# Patient Record
Sex: Female | Born: 1950
Health system: Southern US, Community
[De-identification: ages and names within clinical notes are randomized; demographics above are authoritative.]

---

## 2001-07-20 ENCOUNTER — Emergency Department (HOSPITAL_COMMUNITY): Admission: EM | Admit: 2001-07-20 | Discharge: 2001-07-21 | Payer: Self-pay | Admitting: Emergency Medicine

## 2001-07-21 ENCOUNTER — Encounter: Payer: Self-pay | Admitting: Emergency Medicine

## 2008-03-02 ENCOUNTER — Ambulatory Visit: Payer: Self-pay | Admitting: Internal Medicine

## 2008-03-02 ENCOUNTER — Other Ambulatory Visit: Admission: RE | Admit: 2008-03-02 | Discharge: 2008-03-02 | Payer: Self-pay | Admitting: Internal Medicine

## 2009-07-23 ENCOUNTER — Ambulatory Visit: Payer: Self-pay | Admitting: Internal Medicine

## 2009-09-10 ENCOUNTER — Ambulatory Visit: Payer: Self-pay | Admitting: Internal Medicine

## 2009-09-10 ENCOUNTER — Other Ambulatory Visit: Admission: RE | Admit: 2009-09-10 | Discharge: 2009-09-10 | Payer: Self-pay | Admitting: Internal Medicine

## 2009-11-18 ENCOUNTER — Ambulatory Visit: Payer: Self-pay | Admitting: Internal Medicine

## 2010-02-28 ENCOUNTER — Ambulatory Visit: Payer: Self-pay | Admitting: Internal Medicine

## 2011-08-21 ENCOUNTER — Encounter: Payer: Self-pay | Admitting: Internal Medicine

## 2011-08-21 ENCOUNTER — Ambulatory Visit (INDEPENDENT_AMBULATORY_CARE_PROVIDER_SITE_OTHER): Payer: BC Managed Care – PPO | Admitting: Internal Medicine

## 2011-08-21 VITALS — Temp 97.5°F

## 2011-08-21 DIAGNOSIS — E785 Hyperlipidemia, unspecified: Secondary | ICD-10-CM | POA: Insufficient documentation

## 2011-08-21 DIAGNOSIS — N390 Urinary tract infection, site not specified: Secondary | ICD-10-CM

## 2011-08-21 DIAGNOSIS — R82998 Other abnormal findings in urine: Secondary | ICD-10-CM

## 2011-08-21 LAB — POCT URINALYSIS DIPSTICK
Bilirubin, UA: NEGATIVE
Glucose, UA: NEGATIVE
Ketones, UA: NEGATIVE
Nitrite, UA: NEGATIVE
Protein, UA: NEGATIVE
Spec Grav, UA: <=1.005
Urobilinogen, UA: NEGATIVE
pH, UA: 7

## 2011-08-21 NOTE — Progress Notes (Signed)
  Subjective:    Patient ID: Brittany Mcdowell, female    DOB: [birth year], 61 y.o.   MRN: [id number]  HPI 61 year old white female with history of hyperlipidemia does not want to be on medication for it in today with UTI symptoms. No fever or shaking chills. No nausea.    Review of Systems     Objective:   Physical Exam no CVA tenderness        Assessment & Plan:  Urinary tract infection  Plan: Cipro 500 mg twice daily for 7 days.

## 2011-08-21 NOTE — Patient Instructions (Signed)
Take Cipro 500 mg twice daily for 7 days. Call if not better by Monday, June 3 or sooner if worse.

## 2011-08-23 LAB — URINE CULTURE
Colony Count: NO GROWTH
Organism ID, Bacteria: NO GROWTH

## 2011-10-05 ENCOUNTER — Ambulatory Visit (INDEPENDENT_AMBULATORY_CARE_PROVIDER_SITE_OTHER): Payer: BC Managed Care – PPO | Admitting: Internal Medicine

## 2011-11-19 ENCOUNTER — Other Ambulatory Visit: Payer: BC Managed Care – PPO | Admitting: Internal Medicine

## 2011-11-20 ENCOUNTER — Encounter: Payer: BC Managed Care – PPO | Admitting: Internal Medicine

## 2012-01-21 ENCOUNTER — Other Ambulatory Visit: Payer: BC Managed Care – PPO | Admitting: Internal Medicine

## 2012-01-21 DIAGNOSIS — Z131 Encounter for screening for diabetes mellitus: Secondary | ICD-10-CM

## 2012-01-21 DIAGNOSIS — E785 Hyperlipidemia, unspecified: Secondary | ICD-10-CM

## 2012-01-21 DIAGNOSIS — Z0000 Encounter for general adult medical examination without abnormal findings: Secondary | ICD-10-CM

## 2012-01-21 LAB — LIPID PANEL
Cholesterol: 278 mg/dL — ABNORMAL HIGH (ref 0–200)
HDL: 80 mg/dL (ref >39)
LDL Cholesterol: 169 mg/dL — ABNORMAL HIGH (ref 0–99)
Total CHOL/HDL Ratio: 3.5 Ratio
Triglycerides: 147 mg/dL (ref <150)
VLDL: 29 mg/dL (ref 0–40)

## 2012-01-21 LAB — CBC WITH DIFFERENTIAL/PLATELET
Basophils Absolute: 0 10*3/uL (ref 0.0–0.1)
Basophils Relative: 0 % (ref 0–1)
Eosinophils Absolute: 0.1 10*3/uL (ref 0.0–0.7)
Eosinophils Relative: 3 % (ref 0–5)
HCT: 42.7 % (ref 36.0–46.0)
Hemoglobin: 14.6 g/dL (ref 12.0–15.0)
Lymphocytes Relative: 31 % (ref 12–46)
Lymphs Abs: 1.6 10*3/uL (ref 0.7–4.0)
MCH: 30.7 pg (ref 26.0–34.0)
MCHC: 34.2 g/dL (ref 30.0–36.0)
MCV: 89.9 fL (ref 78.0–100.0)
Monocytes Absolute: 0.6 10*3/uL (ref 0.1–1.0)
Monocytes Relative: 12 % (ref 3–12)
Neutro Abs: 2.9 10*3/uL (ref 1.7–7.7)
Neutrophils Relative %: 54 % (ref 43–77)
Platelets: 361 10*3/uL (ref 150–400)
RBC: 4.75 MIL/uL (ref 3.87–5.11)
RDW: 14 % (ref 11.5–15.5)
WBC: 5.3 10*3/uL (ref 4.0–10.5)

## 2012-01-21 LAB — COMPREHENSIVE METABOLIC PANEL
ALT: 15 U/L (ref 0–35)
AST: 16 U/L (ref 0–37)
Albumin: 4.2 g/dL (ref 3.5–5.2)
Alkaline Phosphatase: 79 U/L (ref 39–117)
BUN: 20 mg/dL (ref 6–23)
CO2: 27 mEq/L (ref 19–32)
Calcium: 9.5 mg/dL (ref 8.4–10.5)
Chloride: 104 mEq/L (ref 96–112)
Creat: 0.88 mg/dL (ref 0.50–1.10)
Glucose, Bld: 82 mg/dL (ref 70–99)
Potassium: 4.3 mEq/L (ref 3.5–5.3)
Sodium: 140 mEq/L (ref 135–145)
Total Bilirubin: 0.5 mg/dL (ref 0.3–1.2)
Total Protein: 7 g/dL (ref 6.0–8.3)

## 2012-01-21 LAB — TSH: TSH: 3.884 u[IU]/mL (ref 0.350–4.500)

## 2012-01-22 ENCOUNTER — Other Ambulatory Visit (HOSPITAL_COMMUNITY)
Admission: RE | Admit: 2012-01-22 | Discharge: 2012-01-22 | Disposition: A | Discharge status: Home / Self Care | Payer: BC Managed Care – PPO | Source: Ambulatory Visit | Attending: Internal Medicine | Admitting: Internal Medicine

## 2012-01-22 ENCOUNTER — Ambulatory Visit (INDEPENDENT_AMBULATORY_CARE_PROVIDER_SITE_OTHER): Payer: BC Managed Care – PPO | Admitting: Internal Medicine

## 2012-01-22 ENCOUNTER — Encounter: Payer: Self-pay | Admitting: Internal Medicine

## 2012-01-22 VITALS — BP 144/92 | HR 80 | Temp 98.6°F | Ht 66.25 in | Wt 154.0 lb

## 2012-01-22 DIAGNOSIS — Z124 Encounter for screening for malignant neoplasm of cervix: Secondary | ICD-10-CM

## 2012-01-22 DIAGNOSIS — Z0000 Encounter for general adult medical examination without abnormal findings: Secondary | ICD-10-CM

## 2012-01-22 DIAGNOSIS — E039 Hypothyroidism, unspecified: Secondary | ICD-10-CM

## 2012-01-22 DIAGNOSIS — Z23 Encounter for immunization: Secondary | ICD-10-CM

## 2012-01-22 DIAGNOSIS — E785 Hyperlipidemia, unspecified: Secondary | ICD-10-CM

## 2012-01-22 DIAGNOSIS — Z01419 Encounter for gynecological examination (general) (routine) without abnormal findings: Secondary | ICD-10-CM | POA: Insufficient documentation

## 2012-01-22 LAB — POCT URINALYSIS DIPSTICK
Blood, UA: NEGATIVE
Glucose, UA: NEGATIVE
Ketones, UA: NEGATIVE
Spec Grav, UA: 1.015

## 2012-01-22 LAB — VITAMIN D 25 HYDROXY (VIT D DEFICIENCY, FRACTURES): Vit D, 25-Hydroxy: 74 ng/mL (ref 30–89)

## 2012-01-22 MED ORDER — TETANUS-DIPHTH-ACELL PERTUSSIS 5-2.5-18.5 LF-MCG/0.5 IM SUSP: 0.5000 mL | Freq: Once | INTRAMUSCULAR | Status: DC

## 2012-05-02 ENCOUNTER — Other Ambulatory Visit: Payer: BC Managed Care – PPO | Admitting: Internal Medicine

## 2012-05-02 ENCOUNTER — Ambulatory Visit (INDEPENDENT_AMBULATORY_CARE_PROVIDER_SITE_OTHER): Payer: BC Managed Care – PPO | Admitting: Internal Medicine

## 2012-05-02 DIAGNOSIS — E785 Hyperlipidemia, unspecified: Secondary | ICD-10-CM

## 2012-05-02 DIAGNOSIS — Z79899 Other long term (current) drug therapy: Secondary | ICD-10-CM

## 2012-05-02 DIAGNOSIS — E039 Hypothyroidism, unspecified: Secondary | ICD-10-CM

## 2012-05-02 LAB — LIPID PANEL
Cholesterol: 195 mg/dL (ref 0–200)
HDL: 88 mg/dL (ref >39)
LDL Cholesterol: 94 mg/dL (ref 0–99)
Total CHOL/HDL Ratio: 2.2 Ratio
Triglycerides: 64 mg/dL (ref <150)
VLDL: 13 mg/dL (ref 0–40)

## 2012-05-02 LAB — TSH: TSH: 1.436 u[IU]/mL (ref 0.350–4.500)

## 2012-05-02 LAB — HEPATIC FUNCTION PANEL
ALT: 19 U/L (ref 0–35)
Albumin: 4.4 g/dL (ref 3.5–5.2)
Total Protein: 7.1 g/dL (ref 6.0–8.3)

## 2012-05-03 ENCOUNTER — Ambulatory Visit (INDEPENDENT_AMBULATORY_CARE_PROVIDER_SITE_OTHER): Payer: BC Managed Care – PPO | Admitting: Internal Medicine

## 2012-05-03 ENCOUNTER — Encounter: Payer: Self-pay | Admitting: Internal Medicine

## 2012-05-03 VITALS — BP 142/100 | HR 72 | Temp 97.7°F | Wt 157.5 lb

## 2012-05-03 DIAGNOSIS — E039 Hypothyroidism, unspecified: Secondary | ICD-10-CM

## 2012-05-03 DIAGNOSIS — I10 Essential (primary) hypertension: Secondary | ICD-10-CM

## 2012-05-03 DIAGNOSIS — E785 Hyperlipidemia, unspecified: Secondary | ICD-10-CM

## 2012-05-03 MED ORDER — LEVOTHYROXINE SODIUM 75 MCG PO TABS: 75.0000 ug | ORAL_TABLET | Freq: Every day | ORAL | Status: DC

## 2012-05-03 MED ORDER — SIMVASTATIN 20 MG PO TABS: 20.0000 mg | ORAL_TABLET | Freq: Every evening | ORAL | Status: DC

## 2012-05-03 MED ORDER — LOSARTAN POTASSIUM 50 MG PO TABS: 50.0000 mg | ORAL_TABLET | Freq: Every day | ORAL | Status: DC

## 2012-05-03 NOTE — Addendum Note (Signed)
Addended by: Judy Pimple on: 05/03/2012 03:33 PM   Modules accepted: Orders

## 2012-05-03 NOTE — Progress Notes (Signed)
  Subjective:    Patient ID: Brittany Mcdowell, female    DOB: July 01, 1950, 62 y.o.   MRN: [id number]  HPI in today to followup on hyperlipidemia and hypothyroidism. Lipid panel has completely normalized on Zocor. Liver functions are normal. TSH is normal on Synthroid 0.075 mg daily. Blood pressure remains elevated diastolically. Patient feels well. She's now into tax season.    Review of Systems     Objective:   Physical Exam no thyromegaly. Chest clear. Cardiac exam regular rate and rhythm. Extremities without edema. Skin is warm and dry.        Assessment & Plan:  Hypertension-diastolic blood pressure elevated begin Cozaar 50 mg daily  Hypothyroidism-stable on Synthroid 0.075 mg daily  Hyperlipidemia-stable on Zocor  Plan: Begin Cozaar 50 mg daily for hypertension. Continue Zocor and Synthroid as previously ordered. Physical exam November 2014.

## 2012-05-03 NOTE — Patient Instructions (Addendum)
Continue same medication and return for physical exam November 2014. Start Cozaar 50 mg daily for hypertension

## 2012-05-28 ENCOUNTER — Encounter: Payer: Self-pay | Admitting: Internal Medicine

## 2012-05-28 NOTE — Patient Instructions (Addendum)
Start lipid-lowering medication and return in 3 months. Resume thyroid replacement medication. Watch blood pressure.

## 2012-05-28 NOTE — Progress Notes (Signed)
  Subjective:    Patient ID: Brittany Mcdowell, female    DOB: 10/12/1950, 62 y.o.   MRN: [id number]  HPI 62 year old white female December 20 oh and hypothyroidism. That time TSH was 5.112 and she was started on Synthroid 0.075 mg daily. Sometimes she is noncompliant with this. Long-standing history of hyperlipidemia with total cholesterol in 2009 been to 72 with LDL cholesterol of 166. Normal triglycerides and HDL cholesterol 79. History of urinary tract infections.  Last Pap smear 2011.  Infrequently seen since 1991 and. Patient had a concussion in may 2003. Tubal ligation December 1990. 23 pregnancies no miscarriages.  Family history: Father with history of dementia glucose intolerance and hyperlipidemia. Mother with history of arthritis. One brother and 3 sisters in good health. 3 adult sons.  Patient does not smoke. Social alcohol consumption consisting of 4 ounces of wine daily. She is married. 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  Blood pressure was normal at 122/82 in 2011. Today it is elevated at 144/92. She has run out of Synthroid at this point in time. Declines influenza immunization. Was given prescription to get Zostavax vaccine at pharmacy. Suggested she start on lipid-lowering medication every check results in 3 months.     Review of Systems  Constitutional: Negative.   All other systems reviewed and are negative.       Objective:   Physical Exam  Vitals reviewed. Constitutional: She is oriented to person, place, and time. She appears well-developed and well-nourished. No distress.  HENT:  Head: Normocephalic and atraumatic.  Right Ear: External ear normal.  Left Ear: External ear normal.  Mouth/Throat: Oropharynx is clear and moist.  Eyes: Conjunctivae and EOM are normal. Pupils are equal, round, and reactive to light. Right eye exhibits no discharge. Left eye exhibits no discharge. No scleral icterus.  Neck:  Neck supple. No JVD present. No thyromegaly present.  Cardiovascular: Normal rate, regular rhythm, normal heart sounds and intact distal pulses.   No murmur heard. Pulmonary/Chest: Effort normal and breath sounds normal. No respiratory distress. She has no wheezes. She has no rales. She exhibits no tenderness.  Breasts normal female  Abdominal: Soft. Bowel sounds are normal. She exhibits no distension and no mass. There is no tenderness. There is no rebound and no guarding.  Genitourinary:  Last Pap 2011. Pap smear taken. Bimanual normal.  Musculoskeletal: Normal range of motion. She exhibits no edema.  Lymphadenopathy:    She has no cervical adenopathy.  Neurological: She is alert and oriented to person, place, and time. She has normal reflexes. No cranial nerve deficit. Coordination normal.  Skin: Skin is warm and dry. No rash noted. She is not diaphoretic.  Psychiatric: She has a normal mood and affect. Judgment and thought content normal.          Assessment & Plan:  History of hypothyroidism-ran out of Synthroid  Hyperlipidemia  Hypertension  Plan: Continue to follow blood pressure. Asked patient to purchase home blood pressure cuff and check at home. Prescribed Zocor 10 mg daily and followup in 3 months. Restart Synthroid 0.075 mg daily. Recommend annual mammogram. Prescription for zoster vaccine. Influenza immunization declined.

## 2012-11-18 ENCOUNTER — Telehealth: Payer: Self-pay | Admitting: Internal Medicine

## 2012-11-18 NOTE — Telephone Encounter (Signed)
Patient says she needs 3 meds refilled. Booked appt to be seen in a few weeks. Front office staff has asked patient to contact pharmacy for refills.

## 2013-01-20 ENCOUNTER — Other Ambulatory Visit: Payer: Self-pay | Admitting: Internal Medicine

## 2013-01-23 ENCOUNTER — Encounter: Payer: Self-pay | Admitting: Internal Medicine

## 2013-01-23 ENCOUNTER — Ambulatory Visit (INDEPENDENT_AMBULATORY_CARE_PROVIDER_SITE_OTHER): Payer: BC Managed Care – PPO | Admitting: Internal Medicine

## 2013-01-23 VITALS — BP 142/88 | HR 88 | Temp 102.1°F | Ht 66.0 in | Wt 162.0 lb

## 2013-01-23 DIAGNOSIS — Z131 Encounter for screening for diabetes mellitus: Secondary | ICD-10-CM

## 2013-01-23 DIAGNOSIS — E039 Hypothyroidism, unspecified: Secondary | ICD-10-CM

## 2013-01-23 DIAGNOSIS — L040 Acute lymphadenitis of face, head and neck: Secondary | ICD-10-CM

## 2013-01-23 DIAGNOSIS — E785 Hyperlipidemia, unspecified: Secondary | ICD-10-CM

## 2013-01-23 DIAGNOSIS — L049 Acute lymphadenitis, unspecified: Secondary | ICD-10-CM

## 2013-01-23 DIAGNOSIS — Z0000 Encounter for general adult medical examination without abnormal findings: Secondary | ICD-10-CM

## 2013-01-23 DIAGNOSIS — Z130 Encounter for screening for diseases of the blood and blood-forming organs and certain disorders involving the immune mechanism: Secondary | ICD-10-CM

## 2013-01-23 DIAGNOSIS — I10 Essential (primary) hypertension: Secondary | ICD-10-CM

## 2013-01-23 DIAGNOSIS — J029 Acute pharyngitis, unspecified: Secondary | ICD-10-CM

## 2013-01-23 LAB — CBC WITH DIFFERENTIAL/PLATELET
Basophils Absolute: 0 10*3/uL (ref 0.0–0.1)
Basophils Relative: 0 % (ref 0–1)
Eosinophils Absolute: 0.1 10*3/uL (ref 0.0–0.7)
Eosinophils Relative: 0 % (ref 0–5)
HCT: 41 % (ref 36.0–46.0)
Hemoglobin: 14 g/dL (ref 12.0–15.0)
Lymphocytes Relative: 5 % — ABNORMAL LOW (ref 12–46)
MCH: 30.1 pg (ref 26.0–34.0)
MCHC: 34.1 g/dL (ref 30.0–36.0)
MCV: 88.2 fL (ref 78.0–100.0)
Monocytes Absolute: 1.4 10*3/uL — ABNORMAL HIGH (ref 0.1–1.0)
Monocytes Relative: 10 % (ref 3–12)
Neutro Abs: 11.9 10*3/uL — ABNORMAL HIGH (ref 1.7–7.7)
RDW: 13.5 % (ref 11.5–15.5)

## 2013-01-23 LAB — POCT RAPID STREP A (OFFICE): Rapid Strep A Screen: POSITIVE — AB

## 2013-01-23 MED ORDER — CEFTRIAXONE SODIUM 1 G IJ SOLR
1.0000 g | Freq: Once | INTRAMUSCULAR | Status: AC
  Administered 2013-01-23: 1 g via INTRAMUSCULAR

## 2013-01-23 MED ORDER — AMOXICILLIN 500 MG PO CAPS: 500.0000 mg | ORAL_CAPSULE | Freq: Three times a day (TID) | ORAL | Status: DC

## 2013-01-23 NOTE — Patient Instructions (Signed)
You have strep throat and are contagious for 24 hours after starting treatment. Please stay home tomorrow. Take amoxicillin 500 mg 3 times daily for 10 days. You have been given an injection of Rocephin today in the office.

## 2013-01-24 LAB — LIPID PANEL
Cholesterol: 198 mg/dL (ref 0–200)
HDL: 82 mg/dL (ref >39)
LDL Cholesterol: 96 mg/dL (ref 0–99)
Triglycerides: 101 mg/dL (ref <150)
VLDL: 20 mg/dL (ref 0–40)

## 2013-01-24 LAB — COMPREHENSIVE METABOLIC PANEL
AST: 12 U/L (ref 0–37)
Albumin: 4 g/dL (ref 3.5–5.2)
Alkaline Phosphatase: 85 U/L (ref 39–117)
BUN: 9 mg/dL (ref 6–23)
Calcium: 9.4 mg/dL (ref 8.4–10.5)
Chloride: 98 mEq/L (ref 96–112)
Creat: 0.72 mg/dL (ref 0.50–1.10)
Glucose, Bld: 115 mg/dL — ABNORMAL HIGH (ref 70–99)
Potassium: 4.1 mEq/L (ref 3.5–5.3)

## 2013-01-24 MED ORDER — HYDROCODONE-ACETAMINOPHEN 10-325 MG PO TABS: 1.0000 | ORAL_TABLET | Freq: Three times a day (TID) | ORAL | Status: DC | PRN

## 2013-01-26 ENCOUNTER — Other Ambulatory Visit: Payer: Self-pay | Admitting: Internal Medicine

## 2013-01-27 ENCOUNTER — Ambulatory Visit (INDEPENDENT_AMBULATORY_CARE_PROVIDER_SITE_OTHER): Payer: BC Managed Care – PPO | Admitting: Internal Medicine

## 2013-01-27 ENCOUNTER — Other Ambulatory Visit (HOSPITAL_COMMUNITY)
Admission: RE | Admit: 2013-01-27 | Discharge: 2013-01-27 | Disposition: A | Discharge status: Home / Self Care | Payer: BC Managed Care – PPO | Source: Ambulatory Visit | Attending: Internal Medicine | Admitting: Internal Medicine

## 2013-01-27 ENCOUNTER — Encounter: Payer: Self-pay | Admitting: Internal Medicine

## 2013-01-27 VITALS — BP 142/94 | HR 80 | Temp 97.9°F | Ht 66.25 in | Wt 162.0 lb

## 2013-01-27 DIAGNOSIS — I10 Essential (primary) hypertension: Secondary | ICD-10-CM

## 2013-01-27 DIAGNOSIS — J020 Streptococcal pharyngitis: Secondary | ICD-10-CM

## 2013-01-27 DIAGNOSIS — Z01419 Encounter for gynecological examination (general) (routine) without abnormal findings: Secondary | ICD-10-CM | POA: Insufficient documentation

## 2013-01-27 DIAGNOSIS — E785 Hyperlipidemia, unspecified: Secondary | ICD-10-CM

## 2013-01-27 DIAGNOSIS — F4321 Adjustment disorder with depressed mood: Secondary | ICD-10-CM

## 2013-01-27 DIAGNOSIS — Z0000 Encounter for general adult medical examination without abnormal findings: Secondary | ICD-10-CM

## 2013-01-27 DIAGNOSIS — E039 Hypothyroidism, unspecified: Secondary | ICD-10-CM

## 2013-01-27 LAB — POCT URINALYSIS DIPSTICK
Glucose, UA: NEGATIVE
Leukocytes, UA: NEGATIVE
Nitrite, UA: NEGATIVE
Protein, UA: NEGATIVE
Urobilinogen, UA: 0.2

## 2013-01-27 NOTE — Patient Instructions (Addendum)
Continue same meds and return in 6 months. Monitor your blood pressure at home. Call if it remains elevated.

## 2013-01-29 NOTE — Progress Notes (Signed)
  Subjective:    Patient ID: Brittany Mcdowell, female    DOB: [birth year], 62 y.o.   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 She was seen earlier this week and treated for strep throat. She apparently contracted strep throat while out of town and her father's funeral in [place].  She has a history of hypertension, hyperlipidemia and hypothyroidism. She is on antihypertensive medication, statin medication and thyroid replacement therapy.  Received DTaP November 2013. Had Pap smear November 2013. Needs mammogram and colonoscopy and these studies were discussed today. She agrees to mammogram and will consider colonoscopy.  Past medical history: Patient had a concussion in May 2003. Tubal ligation December 1990.  Family history: Father just passed away with complications of dementia with with history of glucose intolerance and hyperlipidemia. Mother with history of arthritis. One brother and 3 sisters in good health.   Social history: Patient does not smoke. Social alcohol consumption consisting of 4 ounces of wine daily. She is married. 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 3 adult sons.    Review of Systems  Constitutional:       Grief reaction in loss of father  HENT:       Feeling better with treatment for strep throat.  Eyes: Negative.   Respiratory: Negative.   Cardiovascular: Negative.   Gastrointestinal: Negative.   Endocrine:       Hx of hypothyroidism  Genitourinary: Negative.   Allergic/Immunologic: Negative.   Neurological: Negative.   Hematological: Negative.   Psychiatric/Behavioral: Negative.        Objective:   Physical Exam  Vitals reviewed. Constitutional: She is oriented to person, place, and time. She appears well-developed and well-nourished. No distress.  HENT:  Head: Normocephalic and atraumatic.  Right Ear: External ear  normal.  Left Ear: External ear normal.  Nose: Nose normal.  Mouth/Throat: Oropharynx is clear and moist. No oropharyngeal exudate.  Eyes: Conjunctivae and EOM are normal. Right eye exhibits no discharge. Left eye exhibits no discharge. No scleral icterus.  Neck: Neck supple. No JVD present. No thyromegaly present.  Persistent anterior cervical nodes from Strep throat  Cardiovascular: Normal rate, regular rhythm, normal heart sounds and intact distal pulses.   No murmur heard. Pulmonary/Chest: She has no wheezes. She has no rales. She exhibits no tenderness.  Breasts normal female  Abdominal: Soft. Bowel sounds are normal. She exhibits no distension and no mass. There is no tenderness. There is no rebound and no guarding.  Genitourinary:  Bimanual normal  Musculoskeletal: Normal range of motion. She exhibits no edema.  Lymphadenopathy:    She has cervical adenopathy.  Neurological: She is alert and oriented to person, place, and time. She displays normal reflexes. No cranial nerve deficit. Coordination normal.  Skin: Skin is warm and dry. No rash noted. She is not diaphoretic.  Psychiatric: She has a normal mood and affect. Her behavior is normal. Judgment and thought content normal.          Assessment & Plan:  Hypertension-stable on medication  Hypothyroidism-stable on thyroid replacement therapy  Hyperlipidemia-stable on statin medication  Grief reaction-father just died of complications of dementia  On treatment for strep throat at present which was diagnosed earlier this week  Plan: Return in 6 months for office visit TSH lipid panel liver functions and blood pressure check. To have mammogram and consider colonoscopy.

## 2013-01-29 NOTE — Progress Notes (Signed)
  Subjective:    Patient ID: Brittany Mcdowell, female    DOB: 1950/06/21, 62 y.o.   MRN: [id number]  HPI Patient returns home late last week after attending father's funeral. He died in [place] after battling dementia for a number of years. There were a lot of people at funeral she came in contact with. Subsequently has developed pain in right neck anterior cervical area. Has fever and malaise. Was so uncomfortable last night, she took a leftover pain pill from another prescription. Did not sleep well. Denies sore throat. Doesn't have any URI symptoms. No cough. No vomiting or diarrhea. No recent international travel.    Review of Systems     Objective:   Physical Exam pharynx is slightly injected. Rapid strep screen is positive. TMs are clear. No exudate noted on tonsils. Chest clear to auscultation. Tender right anterior neck with palpable anterior cervical nodes        Assessment & Plan:  Strep pharyngitis  Right cervical adenitis  Grief reaction with loss of father  Plan: Rocephin 1 g IM. Amoxicillin 500 mg 3 times daily for 10 days. She has an upcoming physical exam appointment for Friday, November 7 which she should keep. Out of work today and tomorrow. Fasting labs drawn today.  25 minutes spent with patient

## 2013-02-21 ENCOUNTER — Telehealth: Payer: Self-pay | Admitting: Internal Medicine

## 2013-02-21 NOTE — Telephone Encounter (Signed)
Says she has no refills on chronic medications including Synthroid, Cozaar, Zocor. Refill all of these for 6 months by phone.

## 2013-07-27 ENCOUNTER — Other Ambulatory Visit: Payer: Self-pay | Admitting: Internal Medicine

## 2013-07-27 ENCOUNTER — Other Ambulatory Visit: Payer: BC Managed Care – PPO | Admitting: Internal Medicine

## 2013-07-27 DIAGNOSIS — Z79899 Other long term (current) drug therapy: Secondary | ICD-10-CM

## 2013-07-27 DIAGNOSIS — E785 Hyperlipidemia, unspecified: Secondary | ICD-10-CM

## 2013-07-27 DIAGNOSIS — R7301 Impaired fasting glucose: Secondary | ICD-10-CM

## 2013-07-27 LAB — LIPID PANEL
Cholesterol: 182 mg/dL (ref 0–200)
HDL: 69 mg/dL (ref >39)
LDL Cholesterol: 97 mg/dL (ref 0–99)
Total CHOL/HDL Ratio: 2.6 Ratio
Triglycerides: 80 mg/dL (ref <150)
VLDL: 16 mg/dL (ref 0–40)

## 2013-07-27 LAB — HEPATIC FUNCTION PANEL
ALK PHOS: 76 U/L (ref 39–117)
ALT: 19 U/L (ref 0–35)
AST: 19 U/L (ref 0–37)
Albumin: 4.1 g/dL (ref 3.5–5.2)
BILIRUBIN DIRECT: 0.1 mg/dL (ref 0.0–0.3)
Indirect Bilirubin: 0.5 mg/dL (ref 0.2–1.2)
Total Bilirubin: 0.6 mg/dL (ref 0.2–1.2)
Total Protein: 6.5 g/dL (ref 6.0–8.3)

## 2013-07-27 LAB — HEMOGLOBIN A1C
Hgb A1c MFr Bld: 5.8 % — ABNORMAL HIGH (ref <5.7)
Mean Plasma Glucose: 120 mg/dL — ABNORMAL HIGH (ref <117)

## 2013-07-28 ENCOUNTER — Ambulatory Visit (INDEPENDENT_AMBULATORY_CARE_PROVIDER_SITE_OTHER): Payer: BC Managed Care – PPO | Admitting: Internal Medicine

## 2013-07-28 ENCOUNTER — Encounter: Payer: Self-pay | Admitting: Internal Medicine

## 2013-07-28 VITALS — BP 120/80 | HR 80 | Temp 99.2°F | Wt 151.5 lb

## 2013-07-28 DIAGNOSIS — I10 Essential (primary) hypertension: Secondary | ICD-10-CM

## 2013-07-28 DIAGNOSIS — R7302 Impaired glucose tolerance (oral): Secondary | ICD-10-CM

## 2013-07-28 DIAGNOSIS — E785 Hyperlipidemia, unspecified: Secondary | ICD-10-CM

## 2013-07-28 DIAGNOSIS — R7309 Other abnormal glucose: Secondary | ICD-10-CM

## 2013-07-28 DIAGNOSIS — E039 Hypothyroidism, unspecified: Secondary | ICD-10-CM

## 2013-07-28 LAB — TSH: TSH: 1.162 u[IU]/mL (ref 0.350–4.500)

## 2013-07-28 MED ORDER — SIMVASTATIN 20 MG PO TABS: 20.0000 mg | ORAL_TABLET | Freq: Every day | ORAL | Status: DC

## 2013-07-28 MED ORDER — LOSARTAN POTASSIUM 50 MG PO TABS: 50.0000 mg | ORAL_TABLET | Freq: Every day | ORAL | Status: DC

## 2013-07-28 MED ORDER — LEVOTHYROXINE SODIUM 75 MCG PO TABS: 75.0000 ug | ORAL_TABLET | Freq: Every day | ORAL | Status: DC

## 2013-07-28 NOTE — Patient Instructions (Signed)
Continue same medications and return in 6 months. Continue diet and exercise efforts.

## 2013-07-28 NOTE — Progress Notes (Signed)
   Subjective:    Patient ID: Brittany Mcdowell, female    DOB: 03-Oct-1950, 63 y.o.   MRN: [id number]  HPI In today to followup on  hypertension, hyperlipidemia, hypothyroidism. She made it through tax season just fine. She's [facility] to exercise and has a fit bit. Says tax season is over she can likely go almost every day to exercise. She feels well and has no new complaints. Lipid panel is excellent on statin medication. Her blood pressure is acceptable. TSH is pending. Hemoglobin A1c stable at 5.8%. Liver functions are normal.   Review of Systems     Objective:   Physical Exam Skin warm and dry. Nodes none. Neck is supple without JVD thyromegaly or adenopathy. Chest clear. Cardiac exam regular rate and rhythm normal S1 and S2, extremities without edema       Assessment & Plan:  Hypertension-stable on medication  Hyperlipidemia-normal on statin medication  Hypothyroidism-is on thyroid replacement therapy. TSH is pending  Plan: Due for physical examination November 2015. Continue same medications with 90 day refills.  25 minutes spent with patient

## 2014-01-26 ENCOUNTER — Other Ambulatory Visit: Payer: BC Managed Care – PPO | Admitting: Internal Medicine

## 2014-01-26 DIAGNOSIS — E039 Hypothyroidism, unspecified: Secondary | ICD-10-CM

## 2014-01-26 DIAGNOSIS — Z0000 Encounter for general adult medical examination without abnormal findings: Secondary | ICD-10-CM

## 2014-01-26 DIAGNOSIS — E785 Hyperlipidemia, unspecified: Secondary | ICD-10-CM

## 2014-01-26 DIAGNOSIS — R7309 Other abnormal glucose: Secondary | ICD-10-CM

## 2014-01-26 DIAGNOSIS — I10 Essential (primary) hypertension: Secondary | ICD-10-CM

## 2014-01-26 LAB — CBC WITH DIFFERENTIAL/PLATELET
Basophils Absolute: 0 10*3/uL (ref 0.0–0.1)
Basophils Relative: 0 % (ref 0–1)
EOS PCT: 2 % (ref 0–5)
Eosinophils Absolute: 0.1 10*3/uL (ref 0.0–0.7)
HEMATOCRIT: 41.8 % (ref 36.0–46.0)
Hemoglobin: 13.7 g/dL (ref 12.0–15.0)
LYMPHS PCT: 29 % (ref 12–46)
Lymphs Abs: 1.6 10*3/uL (ref 0.7–4.0)
MCH: 29.8 pg (ref 26.0–34.0)
MCHC: 32.8 g/dL (ref 30.0–36.0)
MCV: 90.9 fL (ref 78.0–100.0)
MONO ABS: 0.8 10*3/uL (ref 0.1–1.0)
MONOS PCT: 14 % — AB (ref 3–12)
Neutro Abs: 3.1 10*3/uL (ref 1.7–7.7)
Neutrophils Relative %: 55 % (ref 43–77)
PLATELETS: 329 10*3/uL (ref 150–400)
RBC: 4.6 MIL/uL (ref 3.87–5.11)
RDW: 14.2 % (ref 11.5–15.5)
WBC: 5.6 10*3/uL (ref 4.0–10.5)

## 2014-01-26 LAB — TSH: TSH: 1.559 u[IU]/mL (ref 0.350–4.500)

## 2014-01-27 LAB — COMPREHENSIVE METABOLIC PANEL
ALT: 20 U/L (ref 0–35)
AST: 20 U/L (ref 0–37)
Albumin: 4 g/dL (ref 3.5–5.2)
Alkaline Phosphatase: 71 U/L (ref 39–117)
BUN: 14 mg/dL (ref 6–23)
CHLORIDE: 99 meq/L (ref 96–112)
CO2: 23 meq/L (ref 19–32)
CREATININE: 0.77 mg/dL (ref 0.50–1.10)
Calcium: 9.4 mg/dL (ref 8.4–10.5)
Glucose, Bld: 90 mg/dL (ref 70–99)
Potassium: 4.3 mEq/L (ref 3.5–5.3)
Sodium: 136 mEq/L (ref 135–145)
TOTAL PROTEIN: 6.4 g/dL (ref 6.0–8.3)
Total Bilirubin: 0.6 mg/dL (ref 0.2–1.2)

## 2014-01-27 LAB — LIPID PANEL
CHOL/HDL RATIO: 2.4 ratio
CHOLESTEROL: 193 mg/dL (ref 0–200)
HDL: 82 mg/dL (ref >39)
LDL Cholesterol: 91 mg/dL (ref 0–99)
Triglycerides: 100 mg/dL (ref <150)
VLDL: 20 mg/dL (ref 0–40)

## 2014-01-27 LAB — HEMOGLOBIN A1C
HEMOGLOBIN A1C: 5.9 % — AB (ref <5.7)
Mean Plasma Glucose: 123 mg/dL — ABNORMAL HIGH (ref <117)

## 2014-01-27 LAB — VITAMIN D 25 HYDROXY (VIT D DEFICIENCY, FRACTURES): VIT D 25 HYDROXY: 91 ng/mL — AB (ref 30–89)

## 2014-01-29 ENCOUNTER — Encounter: Payer: Self-pay | Admitting: Internal Medicine

## 2014-01-29 ENCOUNTER — Ambulatory Visit (INDEPENDENT_AMBULATORY_CARE_PROVIDER_SITE_OTHER): Payer: BC Managed Care – PPO | Admitting: Internal Medicine

## 2014-01-29 VITALS — BP 140/100 | HR 64 | Temp 97.6°F | Ht 65.0 in | Wt 155.0 lb

## 2014-01-29 DIAGNOSIS — Z0000 Encounter for general adult medical examination without abnormal findings: Secondary | ICD-10-CM

## 2014-01-29 DIAGNOSIS — I10 Essential (primary) hypertension: Secondary | ICD-10-CM

## 2014-01-29 DIAGNOSIS — Z7189 Other specified counseling: Secondary | ICD-10-CM

## 2014-01-29 DIAGNOSIS — E785 Hyperlipidemia, unspecified: Secondary | ICD-10-CM

## 2014-01-29 DIAGNOSIS — Z7184 Encounter for health counseling related to travel: Secondary | ICD-10-CM

## 2014-01-29 DIAGNOSIS — E039 Hypothyroidism, unspecified: Secondary | ICD-10-CM

## 2014-01-29 LAB — POCT URINALYSIS DIPSTICK
Bilirubin, UA: NEGATIVE
Blood, UA: NEGATIVE
GLUCOSE UA: NEGATIVE
Ketones, UA: NEGATIVE
Leukocytes, UA: NEGATIVE
NITRITE UA: NEGATIVE
Protein, UA: NEGATIVE
Spec Grav, UA: <=1.005
UROBILINOGEN UA: NEGATIVE
pH, UA: 6.5

## 2014-01-29 MED ORDER — CIPROFLOXACIN HCL 500 MG PO TABS: 500.0000 mg | ORAL_TABLET | Freq: Two times a day (BID) | ORAL | Status: DC

## 2014-01-29 MED ORDER — PROMETHAZINE HCL 25 MG PO TABS: 25.0000 mg | ORAL_TABLET | ORAL | Status: DC | PRN

## 2014-05-13 NOTE — Patient Instructions (Addendum)
Return in 6 months or as needed. Cipro and Phenergan prescriptions given for trip to [place]. Consider screening colonoscopy. Have annual mammogram.

## 2014-05-13 NOTE — Progress Notes (Signed)
   Subjective:    Patient ID: Brittany Mcdowell, female    DOB: 01-28-1951, 64 y.o.   MRN: [id number]  HPI 64 year old White Female in today for health maintenance exam. Planning a trip to [place] very soon.  She has a history of hypertension, hyperlipidemia, hypothyroidism. She is on medication for these issues.  Past medical history: Concussion May 2003. Tubal ligation December 1990. Strep throat November 2014.  Social history: Patient does not smoke. Social alcohol consumption consisting of 4 ounces of wine daily. She is married. Has a Masters degree. 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 3 adult sons.  Family history: Father died with complications of dementia with history of glucose intolerance and hyperlipidemia. Mother with history of arthritis. One brother and 3 sisters in good health.  Tetanus immunization November 2013. Pap smear November 2013. Reminded about annual mammogram and screening colonoscopy.    Review of Systems  Constitutional: Negative.   Eyes: Negative.   Respiratory: Negative.   Cardiovascular: Negative.   Gastrointestinal: Negative.   Genitourinary: Negative.   Neurological: Negative.   Psychiatric/Behavioral: Negative.        Objective:   Physical Exam  Constitutional: She is oriented to person, place, and time. She appears well-developed and well-nourished. No distress.  HENT:  Head: Normocephalic and atraumatic.  Right Ear: External ear normal.  Left Ear: External ear normal.  Mouth/Throat: Oropharynx is clear and moist. No oropharyngeal exudate.  Eyes: Conjunctivae and EOM are normal. Pupils are equal, round, and reactive to light. Right eye exhibits no discharge. Left eye exhibits no discharge. No scleral icterus.  Neck: Neck supple. No JVD present. No thyromegaly present.  Cardiovascular: Normal rate, regular rhythm and normal heart sounds.   Pulmonary/Chest: Effort normal and breath sounds normal. She has no wheezes.  She has no rales.  Breast normal female without masses  Abdominal: Soft. Bowel sounds are normal. She exhibits no distension and no mass. There is no tenderness. There is no rebound and no guarding.  Genitourinary:  Bimanual normal. Pap done 2014  Musculoskeletal: Normal range of motion. She exhibits no edema.  Lymphadenopathy:    She has no cervical adenopathy.  Neurological: She is alert and oriented to person, place, and time. She has normal reflexes. No cranial nerve deficit. Coordination normal.  Skin: Skin is warm and dry. No rash noted. She is not diaphoretic.  Psychiatric: She has a normal mood and affect. Her behavior is normal. Judgment and thought content normal.  Vitals reviewed.         Assessment & Plan:  Essential hypertension-well-controlled on current regimen  Hyperlipidemia-on statin medication  Hypothyroidism-on thyroid replacement therapy  Upcoming trip to [place]-travel advice given  Plan: Return in 6 months for office visit TSH and fasting lipid panel with liver functions. Keep on blood pressure at home.

## 2014-07-23 ENCOUNTER — Other Ambulatory Visit: Payer: BC Managed Care – PPO | Admitting: Internal Medicine

## 2014-07-24 ENCOUNTER — Ambulatory Visit: Payer: BC Managed Care – PPO | Admitting: Internal Medicine

## 2014-07-30 ENCOUNTER — Other Ambulatory Visit: Payer: BLUE CROSS/BLUE SHIELD | Admitting: Internal Medicine

## 2014-07-30 DIAGNOSIS — E785 Hyperlipidemia, unspecified: Secondary | ICD-10-CM

## 2014-07-30 DIAGNOSIS — Z79899 Other long term (current) drug therapy: Secondary | ICD-10-CM

## 2014-07-30 DIAGNOSIS — E119 Type 2 diabetes mellitus without complications: Secondary | ICD-10-CM

## 2014-07-30 LAB — HEPATIC FUNCTION PANEL
ALK PHOS: 80 U/L (ref 39–117)
ALT: 22 U/L (ref 0–35)
AST: 20 U/L (ref 0–37)
Albumin: 4.3 g/dL (ref 3.5–5.2)
BILIRUBIN INDIRECT: 0.6 mg/dL (ref 0.2–1.2)
Bilirubin, Direct: 0.1 mg/dL (ref 0.0–0.3)
TOTAL PROTEIN: 7.2 g/dL (ref 6.0–8.3)
Total Bilirubin: 0.7 mg/dL (ref 0.2–1.2)

## 2014-07-30 LAB — LIPID PANEL
Cholesterol: 273 mg/dL — ABNORMAL HIGH (ref 0–200)
HDL: 98 mg/dL (ref >=46)
LDL CALC: 153 mg/dL — AB (ref 0–99)
Total CHOL/HDL Ratio: 2.8 Ratio
Triglycerides: 109 mg/dL (ref <150)
VLDL: 22 mg/dL (ref 0–40)

## 2014-07-30 LAB — HEMOGLOBIN A1C
Hgb A1c MFr Bld: 5.8 % — ABNORMAL HIGH (ref <5.7)
Mean Plasma Glucose: 120 mg/dL — ABNORMAL HIGH (ref <117)

## 2014-07-31 ENCOUNTER — Encounter: Payer: Self-pay | Admitting: Internal Medicine

## 2014-07-31 ENCOUNTER — Ambulatory Visit (INDEPENDENT_AMBULATORY_CARE_PROVIDER_SITE_OTHER): Payer: BLUE CROSS/BLUE SHIELD | Admitting: Internal Medicine

## 2014-07-31 VITALS — BP 140/98 | HR 71 | Temp 98.1°F | Wt 159.0 lb

## 2014-07-31 DIAGNOSIS — E119 Type 2 diabetes mellitus without complications: Secondary | ICD-10-CM | POA: Diagnosis not present

## 2014-07-31 NOTE — Patient Instructions (Signed)
Please diet exercise and lose weight. Return in 3 months. Continue same medications for the present time.

## 2014-07-31 NOTE — Progress Notes (Signed)
   Subjective:    Patient ID: Brittany PettiesLinda S Mcdowell, female    DOB: 04-23-1950, 64 y.o.   MRN: [id number]  HPI six-month follow-up on hypertension, hyperlipidemia, hypothyroidism and impaired glucose tolerance. Hemoglobin A1c stable at 5.8%. Just finished tax season. Did not get much exercise and probably did not follow low-fat diet. Was painful taking her medications. Blood pressure is elevated. She's not willing to go up on Cozaar at this point in time. Also total cholesterol is elevated at 273 which is surprising on lipid-lowering medication. LDL cholesterol is 153.    Review of Systems     Objective:   Physical Exam  Skin warm and dry. Nodes none. Chest clear to auscultation. Cardiac exam regular rate and rhythm. Extremities without edema. Repeat blood pressure check 150/90      Assessment & Plan:  Elevated blood pressure  Essential hypertension  Hyperlipidemia-elevated total and LDL cholesterol despite being on lipid-lowering medication.  Hypothyroidism-stable on thyroid replacement   Impaired glucose tolerance-stable at 5.8%  Plan: Return in 3 months will need fasting lipid panel without liver functions office visit and blood pressure check. This will give her a chance to get back into her diet and exercise regimen.

## 2014-08-01 LAB — MICROALBUMIN / CREATININE URINE RATIO
Creatinine, Urine: 18.9 mg/dL
Microalb, Ur: <0.2 mg/dL (ref <2.0)

## 2014-08-14 ENCOUNTER — Other Ambulatory Visit: Payer: Self-pay | Admitting: Internal Medicine

## 2014-10-09 ENCOUNTER — Telehealth: Payer: Self-pay

## 2014-10-09 NOTE — Telephone Encounter (Signed)
Left message for patient to call back to inform her she is due for a mammogram.

## 2014-10-30 ENCOUNTER — Other Ambulatory Visit: Payer: BLUE CROSS/BLUE SHIELD | Admitting: Internal Medicine

## 2014-11-01 ENCOUNTER — Ambulatory Visit: Payer: BLUE CROSS/BLUE SHIELD | Admitting: Internal Medicine

## 2015-01-28 ENCOUNTER — Other Ambulatory Visit: Payer: BLUE CROSS/BLUE SHIELD | Admitting: Internal Medicine

## 2015-01-28 DIAGNOSIS — E039 Hypothyroidism, unspecified: Secondary | ICD-10-CM

## 2015-01-28 DIAGNOSIS — Z0000 Encounter for general adult medical examination without abnormal findings: Secondary | ICD-10-CM

## 2015-01-28 DIAGNOSIS — E785 Hyperlipidemia, unspecified: Secondary | ICD-10-CM

## 2015-01-28 DIAGNOSIS — I10 Essential (primary) hypertension: Secondary | ICD-10-CM

## 2015-01-28 DIAGNOSIS — Z13820 Encounter for screening for osteoporosis: Secondary | ICD-10-CM

## 2015-01-28 DIAGNOSIS — R7302 Impaired glucose tolerance (oral): Secondary | ICD-10-CM

## 2015-01-28 LAB — LIPID PANEL
CHOL/HDL RATIO: 2.2 ratio (ref <=5.0)
CHOLESTEROL: 204 mg/dL — AB (ref 125–200)
HDL: 91 mg/dL (ref >=46)
LDL CALC: 95 mg/dL (ref <130)
TRIGLYCERIDES: 89 mg/dL (ref <150)
VLDL: 18 mg/dL (ref <30)

## 2015-01-28 LAB — COMPLETE METABOLIC PANEL WITH GFR
ALT: 16 U/L (ref 6–29)
AST: 18 U/L (ref 10–35)
Albumin: 4 g/dL (ref 3.6–5.1)
Alkaline Phosphatase: 71 U/L (ref 33–130)
BUN: 13 mg/dL (ref 7–25)
CHLORIDE: 101 mmol/L (ref 98–110)
CO2: 26 mmol/L (ref 20–31)
Calcium: 9.1 mg/dL (ref 8.6–10.4)
Creat: 0.71 mg/dL (ref 0.50–0.99)
GLUCOSE: 88 mg/dL (ref 65–99)
Potassium: 4.4 mmol/L (ref 3.5–5.3)
Sodium: 138 mmol/L (ref 135–146)
TOTAL PROTEIN: 6.5 g/dL (ref 6.1–8.1)
Total Bilirubin: 0.6 mg/dL (ref 0.2–1.2)

## 2015-01-28 LAB — CBC WITH DIFFERENTIAL/PLATELET
BASOS ABS: 0.1 10*3/uL (ref 0.0–0.1)
BASOS PCT: 1 % (ref 0–1)
Eosinophils Absolute: 0.2 10*3/uL (ref 0.0–0.7)
Eosinophils Relative: 3 % (ref 0–5)
HCT: 43.2 % (ref 36.0–46.0)
Hemoglobin: 14.4 g/dL (ref 12.0–15.0)
LYMPHS ABS: 1.4 10*3/uL (ref 0.7–4.0)
Lymphocytes Relative: 24 % (ref 12–46)
MCH: 30.4 pg (ref 26.0–34.0)
MCHC: 33.3 g/dL (ref 30.0–36.0)
MCV: 91.1 fL (ref 78.0–100.0)
MPV: 9.4 fL (ref 8.6–12.4)
Monocytes Absolute: 0.8 10*3/uL (ref 0.1–1.0)
Monocytes Relative: 14 % — ABNORMAL HIGH (ref 3–12)
NEUTROS PCT: 58 % (ref 43–77)
Neutro Abs: 3.4 10*3/uL (ref 1.7–7.7)
Platelets: 342 10*3/uL (ref 150–400)
RBC: 4.74 MIL/uL (ref 3.87–5.11)
RDW: 14.2 % (ref 11.5–15.5)
WBC: 5.8 10*3/uL (ref 4.0–10.5)

## 2015-01-28 LAB — TSH: TSH: 1.04 u[IU]/mL (ref 0.350–4.500)

## 2015-01-29 LAB — MICROALBUMIN, URINE: Microalb, Ur: 0.3 mg/dL

## 2015-01-29 LAB — HEMOGLOBIN A1C
Hgb A1c MFr Bld: 5.9 % — ABNORMAL HIGH (ref <5.7)
Mean Plasma Glucose: 123 mg/dL — ABNORMAL HIGH (ref <117)

## 2015-01-29 LAB — VITAMIN D 25 HYDROXY (VIT D DEFICIENCY, FRACTURES): Vit D, 25-Hydroxy: 52 ng/mL (ref 30–100)

## 2015-02-05 ENCOUNTER — Encounter: Payer: BLUE CROSS/BLUE SHIELD | Admitting: Internal Medicine

## 2015-02-06 ENCOUNTER — Encounter: Payer: Self-pay | Admitting: Internal Medicine

## 2015-02-06 ENCOUNTER — Other Ambulatory Visit: Payer: Self-pay

## 2015-02-06 ENCOUNTER — Other Ambulatory Visit: Payer: Self-pay | Admitting: Internal Medicine

## 2015-02-06 ENCOUNTER — Ambulatory Visit (INDEPENDENT_AMBULATORY_CARE_PROVIDER_SITE_OTHER): Payer: BLUE CROSS/BLUE SHIELD | Admitting: Internal Medicine

## 2015-02-06 VITALS — BP 132/88 | HR 69 | Temp 97.1°F | Resp 18 | Ht 66.0 in | Wt 154.0 lb

## 2015-02-06 DIAGNOSIS — E8881 Metabolic syndrome: Secondary | ICD-10-CM

## 2015-02-06 DIAGNOSIS — Z1231 Encounter for screening mammogram for malignant neoplasm of breast: Secondary | ICD-10-CM

## 2015-02-06 DIAGNOSIS — E2839 Other primary ovarian failure: Secondary | ICD-10-CM

## 2015-02-06 DIAGNOSIS — Z0000 Encounter for general adult medical examination without abnormal findings: Secondary | ICD-10-CM | POA: Diagnosis not present

## 2015-02-06 DIAGNOSIS — I10 Essential (primary) hypertension: Secondary | ICD-10-CM

## 2015-02-06 DIAGNOSIS — E039 Hypothyroidism, unspecified: Secondary | ICD-10-CM

## 2015-02-06 DIAGNOSIS — M8580 Other specified disorders of bone density and structure, unspecified site: Secondary | ICD-10-CM

## 2015-02-06 DIAGNOSIS — E785 Hyperlipidemia, unspecified: Secondary | ICD-10-CM

## 2015-02-06 DIAGNOSIS — R7302 Impaired glucose tolerance (oral): Secondary | ICD-10-CM

## 2015-02-07 ENCOUNTER — Other Ambulatory Visit: Payer: Self-pay | Admitting: Internal Medicine

## 2015-02-20 NOTE — Progress Notes (Signed)
   Subjective:    Patient ID: Brittany PettiesLinda S Mermelstein, female    DOB: Sep 06, 1950, 64 y.o.   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 She is looking for another firm to join. Also, her husband has cancer and has been seriously ill.  History of essential hypertension, hyperlipidemia, hypothyroidism.  Past medical history: Patient had concussion May 2003. Bilateral tubal ligation December 1990. Had strep throat November 2014.  Social history: She does not smoke. Social alcohol consumption consisting of 4 ounces of wine daily. She is married. 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  Family history: Father died of complications of dementia with history of glucose intolerance and hyperlipidemia. Mother with history of arthritis. One brother and 3 sisters in good health.  Tetanus immunization November 2013. Pap smear November 2013.  Reminded about a normal mammogram and screening colonoscopy.        Review of Systems  Constitutional: Negative.   All other systems reviewed and are negative.      Objective:   Physical Exam  Constitutional: She is oriented to person, place, and time. She appears well-developed and well-nourished. No distress.  HENT:  Head: Normocephalic and atraumatic.  Right Ear: External ear normal.  Left Ear: External ear normal.  Mouth/Throat: Oropharynx is clear and moist. No oropharyngeal exudate.  Eyes: Conjunctivae and EOM are normal. Pupils are equal, round, and reactive to light. Right eye exhibits no discharge. Left eye exhibits no discharge. No scleral icterus.  Neck: Neck supple. No JVD present. No thyromegaly present.  Cardiovascular: Normal rate, regular rhythm and normal heart sounds.   No murmur heard. Pulmonary/Chest: Effort normal and breath  sounds normal. She has no wheezes. She has no rales.  Breasts normal female without masses  Abdominal: Soft. Bowel sounds are normal. She exhibits no distension and no mass. There is no tenderness. There is no rebound and no guarding.  Genitourinary:  Pap due 2017 bimanual normal.  Musculoskeletal: She exhibits no edema.  Lymphadenopathy:    She has no cervical adenopathy.  Neurological: She is alert and oriented to person, place, and time. She has normal reflexes. No cranial nerve deficit. Coordination normal.  Skin: Skin is warm and dry. No rash noted. She is not diaphoretic.  Psychiatric: She has a normal mood and affect. Her behavior is normal. Judgment and thought content normal.  Vitals reviewed.         Assessment & Plan:  Normal health maintenance exam  Hyperlipidemia-lipid panel stable  Hypertension-stable on current regimen  Hypothyroidism-stable with thyroid replacement  Osteopenia-bone density study December 2016 consistent with osteopenia and is on Fosamax  Impaired glucose tolerance-11 A1c 5.9%. Work on diet and exercise and recheck in 6 months  Metabolic syndrome-diet and exercise recommended  Plan: Continue same medications and return in 6 months.

## 2015-03-12 ENCOUNTER — Other Ambulatory Visit: Payer: Self-pay

## 2015-03-12 ENCOUNTER — Other Ambulatory Visit: Payer: Self-pay | Admitting: Internal Medicine

## 2015-03-12 DIAGNOSIS — Z1231 Encounter for screening mammogram for malignant neoplasm of breast: Secondary | ICD-10-CM

## 2015-03-12 DIAGNOSIS — E2839 Other primary ovarian failure: Secondary | ICD-10-CM

## 2015-03-14 ENCOUNTER — Ambulatory Visit
Admission: RE | Admit: 2015-03-14 | Discharge: 2015-03-14 | Disposition: A | Discharge status: Home / Self Care | Payer: BLUE CROSS/BLUE SHIELD | Source: Ambulatory Visit | Attending: Internal Medicine | Admitting: Internal Medicine

## 2015-03-14 DIAGNOSIS — Z1231 Encounter for screening mammogram for malignant neoplasm of breast: Secondary | ICD-10-CM

## 2015-03-14 DIAGNOSIS — E2839 Other primary ovarian failure: Secondary | ICD-10-CM

## 2015-03-20 ENCOUNTER — Telehealth: Payer: Self-pay

## 2015-03-20 ENCOUNTER — Other Ambulatory Visit: Payer: Self-pay | Admitting: Internal Medicine

## 2015-03-20 DIAGNOSIS — R928 Other abnormal and inconclusive findings on diagnostic imaging of breast: Secondary | ICD-10-CM

## 2015-03-20 MED ORDER — ALENDRONATE SODIUM 70 MG PO TABS
70.0000 mg | ORAL_TABLET | ORAL | Status: DC
Start: 2015-03-20 — End: 2016-02-10

## 2015-03-20 NOTE — Telephone Encounter (Signed)
-----   Message from Margaree MackintoshMary J Baxley, MD sent at 03/18/2015  1:24 PM EST ----- Patient has osteopenia. Would she try Fosamax 70 mg weekly to improve bone mass?

## 2015-03-20 NOTE — Telephone Encounter (Signed)
Patient notified and agrees to fosamax; prescription sent to pharmacy

## 2015-03-27 ENCOUNTER — Ambulatory Visit
Admission: RE | Admit: 2015-03-27 | Discharge: 2015-03-27 | Disposition: A | Discharge status: Home / Self Care | Payer: BLUE CROSS/BLUE SHIELD | Source: Ambulatory Visit | Attending: Internal Medicine | Admitting: Internal Medicine

## 2015-03-27 DIAGNOSIS — R928 Other abnormal and inconclusive findings on diagnostic imaging of breast: Secondary | ICD-10-CM

## 2015-05-15 DIAGNOSIS — M810 Age-related osteoporosis without current pathological fracture: Secondary | ICD-10-CM | POA: Insufficient documentation

## 2015-05-15 NOTE — Patient Instructions (Signed)
Was pleasure to see you today. Continue to work on diet exercise and weight loss. Continue same medications. Return in 6 months. Appointment booked mid-May.

## 2015-06-24 ENCOUNTER — Encounter: Payer: Self-pay | Admitting: Internal Medicine

## 2015-06-24 ENCOUNTER — Ambulatory Visit (INDEPENDENT_AMBULATORY_CARE_PROVIDER_SITE_OTHER): Payer: BLUE CROSS/BLUE SHIELD | Admitting: Internal Medicine

## 2015-06-24 ENCOUNTER — Telehealth: Payer: Self-pay

## 2015-06-24 VITALS — BP 148/96 | HR 82 | Temp 98.8°F | Resp 18 | Wt 154.0 lb

## 2015-06-24 DIAGNOSIS — R300 Dysuria: Secondary | ICD-10-CM

## 2015-06-24 DIAGNOSIS — N390 Urinary tract infection, site not specified: Secondary | ICD-10-CM

## 2015-06-24 DIAGNOSIS — R8290 Unspecified abnormal findings in urine: Secondary | ICD-10-CM | POA: Diagnosis not present

## 2015-06-24 LAB — POCT URINALYSIS DIPSTICK
BILIRUBIN UA: NEGATIVE
Glucose, UA: NEGATIVE
Ketones, UA: NEGATIVE
NITRITE UA: POSITIVE
PH UA: 7.5
PROTEIN UA: 30
Spec Grav, UA: 1.025
UROBILINOGEN UA: 0.2

## 2015-06-24 MED ORDER — CIPROFLOXACIN HCL 500 MG PO TABS: 500.0000 mg | ORAL_TABLET | Freq: Two times a day (BID) | ORAL | Status: DC

## 2015-06-24 NOTE — Patient Instructions (Signed)
Cipro 500 mg twice daily for 7 days. Culture pending. May take Azo-Standard over-the-counter for dysuria

## 2015-06-24 NOTE — Telephone Encounter (Signed)
Patient notified and scheduled for 12:00 today

## 2015-06-24 NOTE — Telephone Encounter (Signed)
Patient states that she woke up with burning and blood tinged urine. She states that she would like to come by and just leave a urine specimen. Please advise. CB# [phone]

## 2015-06-24 NOTE — Telephone Encounter (Signed)
Book OV 

## 2015-06-24 NOTE — Progress Notes (Signed)
   Subjective:    Patient ID: Brittany Mcdowell, female    DOB: 1950/09/30, 65 y.o.   MRN: [id number]  HPI  65 year old Female in today with UTI symptoms. Husband past away in March from complications of cancer. She has been working at [employer] and  [employer] during this tax season.    Awakened this morning with UTI symptoms. Last UTI on file was in 2013. At that time, culture revealed no growth but she was treated with Cipro and improved. No fever or shaking chills.    Review of Systems     Objective:   Physical Exam   Urine dipstick is abnormal with 3+ LE. Culture sent. No CVA tenderness.       Assessment & Plan:   acute UTI  Plan: Cipro 500 mg twice daily for 7 days. Culture is pending.

## 2015-06-26 LAB — CULTURE, URINE COMPREHENSIVE: Colony Count: >=100000

## 2015-07-23 ENCOUNTER — Other Ambulatory Visit: Payer: Self-pay | Admitting: Internal Medicine

## 2015-08-06 ENCOUNTER — Other Ambulatory Visit: Payer: BLUE CROSS/BLUE SHIELD | Admitting: Internal Medicine

## 2015-08-06 DIAGNOSIS — E119 Type 2 diabetes mellitus without complications: Secondary | ICD-10-CM

## 2015-08-06 DIAGNOSIS — Z79899 Other long term (current) drug therapy: Secondary | ICD-10-CM

## 2015-08-06 DIAGNOSIS — E785 Hyperlipidemia, unspecified: Secondary | ICD-10-CM

## 2015-08-06 LAB — HEPATIC FUNCTION PANEL
ALK PHOS: 50 U/L (ref 33–130)
ALT: 18 U/L (ref 6–29)
AST: 18 U/L (ref 10–35)
Albumin: 4.1 g/dL (ref 3.6–5.1)
BILIRUBIN DIRECT: 0.1 mg/dL (ref <=0.2)
BILIRUBIN TOTAL: 0.6 mg/dL (ref 0.2–1.2)
Indirect Bilirubin: 0.5 mg/dL (ref 0.2–1.2)
Total Protein: 6.4 g/dL (ref 6.1–8.1)

## 2015-08-06 LAB — LIPID PANEL
Cholesterol: 210 mg/dL — ABNORMAL HIGH (ref 125–200)
HDL: 86 mg/dL (ref >=46)
LDL CALC: 109 mg/dL (ref <130)
Total CHOL/HDL Ratio: 2.4 Ratio (ref <=5.0)
Triglycerides: 77 mg/dL (ref <150)
VLDL: 15 mg/dL (ref <30)

## 2015-08-06 LAB — HEMOGLOBIN A1C
Hgb A1c MFr Bld: 5.8 % — ABNORMAL HIGH (ref <5.7)
Mean Plasma Glucose: 120 mg/dL

## 2015-08-08 ENCOUNTER — Ambulatory Visit: Payer: BLUE CROSS/BLUE SHIELD | Admitting: Internal Medicine

## 2015-08-20 ENCOUNTER — Ambulatory Visit (INDEPENDENT_AMBULATORY_CARE_PROVIDER_SITE_OTHER): Payer: BLUE CROSS/BLUE SHIELD | Admitting: Internal Medicine

## 2015-08-20 ENCOUNTER — Encounter: Payer: Self-pay | Admitting: Internal Medicine

## 2015-08-20 VITALS — BP 112/68 | HR 60 | Temp 98.4°F | Resp 18 | Ht 66.0 in | Wt 159.0 lb

## 2015-08-20 DIAGNOSIS — E785 Hyperlipidemia, unspecified: Secondary | ICD-10-CM | POA: Diagnosis not present

## 2015-08-20 DIAGNOSIS — R7302 Impaired glucose tolerance (oral): Secondary | ICD-10-CM

## 2015-08-20 DIAGNOSIS — I10 Essential (primary) hypertension: Secondary | ICD-10-CM

## 2015-08-20 DIAGNOSIS — E039 Hypothyroidism, unspecified: Secondary | ICD-10-CM | POA: Diagnosis not present

## 2015-08-20 DIAGNOSIS — F4321 Adjustment disorder with depressed mood: Secondary | ICD-10-CM | POA: Diagnosis not present

## 2015-08-20 NOTE — Progress Notes (Signed)
   Subjective:    Patient ID: Brittany Mcdowell, female    DOB: [birth year], 65 y.o.   MRN: [id number]  HPI In today for six-month recheck on impaired glucose tolerance. Hemoglobin A1c stable at 5.8%. Also for follow-up on hypertension, hyperlipidemia and hypothyroidism. She is adjusting to her husband's passing. Planning a trip to [place] in October.    Review of Systems see above     Objective:   Physical Exam  Skin: warm and dry. Nodes none. Neck supple. Chest clear. Cardiac exam: regular rate and rhythm normal S1 and S2. Extremities without edema.      Assessment & Plan:  Impaired glucose tolerance-hemoglobin A1c 5.8%  Essential hypertension-stable on current regimen  Hyperlipidemia-lipid panel stable. HDL cholesterol is 86. Total cholesterol 210. LDL cholesterol 109.  Hypothyroidism-TSH within normal limits  Grief reaction-patient coping well.  Plan: Continue same medications and return in 6 months. Continue to work on diet and exercise.

## 2015-08-20 NOTE — Patient Instructions (Signed)
Continue diet and exercise efforts. Continue same medications. Return in 6 months for physical exam. 

## 2016-01-24 ENCOUNTER — Encounter: Payer: Self-pay | Admitting: Internal Medicine

## 2016-01-24 ENCOUNTER — Ambulatory Visit (INDEPENDENT_AMBULATORY_CARE_PROVIDER_SITE_OTHER): Payer: BLUE CROSS/BLUE SHIELD | Admitting: Internal Medicine

## 2016-01-24 VITALS — Temp 97.5°F

## 2016-01-24 DIAGNOSIS — R300 Dysuria: Secondary | ICD-10-CM

## 2016-01-24 DIAGNOSIS — N390 Urinary tract infection, site not specified: Secondary | ICD-10-CM | POA: Diagnosis not present

## 2016-01-24 LAB — POCT URINALYSIS DIPSTICK
Bilirubin, UA: NEGATIVE
Glucose, UA: NEGATIVE
KETONES UA: NEGATIVE
NITRITE UA: NEGATIVE
Spec Grav, UA: 1.01
UROBILINOGEN UA: 0.2
pH, UA: 6

## 2016-01-24 MED ORDER — CIPROFLOXACIN HCL 500 MG PO TABS: 500.0000 mg | ORAL_TABLET | Freq: Two times a day (BID) | ORAL | 0 refills | Status: DC

## 2016-01-24 NOTE — Progress Notes (Signed)
   Subjective:    Patient ID: Brittany Mcdowell, female    DOB: [birth year], 65 y.o.   MRN: [id number]  HPI Onset of UTI symptoms as morning with frequency and urgency. Urinalysis is abnormal. Culture sent.  Last UTI was April 2017. Culture grew greater than 100,000 colonies per milliliter Escherichia coli sensitive to Cipro which she was treated with. Has had no further symptoms since that time.  Has follow-up appointment here November 17 for physical examination    Review of Systems no nausea vomiting or back pain. No shaking chills.     Objective:   Physical Exam  See  point of care urine testing. No CVA tenderness      Assessment & Plan:  Acute UTI  Plan: Cipro 500 mg twice daily for 10 days.

## 2016-01-24 NOTE — Patient Instructions (Addendum)
Cipro 500 mg twice daily for 10 days. Culture pending. See you for follow-up in November

## 2016-01-26 LAB — URINE CULTURE: Organism ID, Bacteria: NO GROWTH

## 2016-02-06 ENCOUNTER — Other Ambulatory Visit: Payer: BLUE CROSS/BLUE SHIELD | Admitting: Internal Medicine

## 2016-02-06 DIAGNOSIS — E039 Hypothyroidism, unspecified: Secondary | ICD-10-CM

## 2016-02-06 DIAGNOSIS — E785 Hyperlipidemia, unspecified: Secondary | ICD-10-CM

## 2016-02-06 DIAGNOSIS — R7302 Impaired glucose tolerance (oral): Secondary | ICD-10-CM

## 2016-02-06 DIAGNOSIS — I10 Essential (primary) hypertension: Secondary | ICD-10-CM

## 2016-02-06 LAB — COMPLETE METABOLIC PANEL WITH GFR
ALT: 16 U/L (ref 6–29)
AST: 18 U/L (ref 10–35)
Albumin: 4.2 g/dL (ref 3.6–5.1)
Alkaline Phosphatase: 51 U/L (ref 33–130)
BILIRUBIN TOTAL: 0.6 mg/dL (ref 0.2–1.2)
BUN: 12 mg/dL (ref 7–25)
CO2: 27 mmol/L (ref 20–31)
Calcium: 9.3 mg/dL (ref 8.6–10.4)
Chloride: 103 mmol/L (ref 98–110)
Creat: 0.76 mg/dL (ref 0.50–0.99)
GFR, EST NON AFRICAN AMERICAN: 83 mL/min (ref >=60)
GFR, Est African American: >89 mL/min (ref >=60)
GLUCOSE: 101 mg/dL — AB (ref 65–99)
POTASSIUM: 4.5 mmol/L (ref 3.5–5.3)
SODIUM: 139 mmol/L (ref 135–146)
TOTAL PROTEIN: 7 g/dL (ref 6.1–8.1)

## 2016-02-06 LAB — CBC WITH DIFFERENTIAL/PLATELET
BASOS ABS: 0 {cells}/uL (ref 0–200)
Basophils Relative: 0 %
EOS PCT: 2 %
Eosinophils Absolute: 100 cells/uL (ref 15–500)
HCT: 43.5 % (ref 35.0–45.0)
Hemoglobin: 14.5 g/dL (ref 11.7–15.5)
Lymphocytes Relative: 28 %
Lymphs Abs: 1400 cells/uL (ref 850–3900)
MCH: 30.5 pg (ref 27.0–33.0)
MCHC: 33.3 g/dL (ref 32.0–36.0)
MCV: 91.6 fL (ref 80.0–100.0)
MONOS PCT: 11 %
MPV: 9.6 fL (ref 7.5–12.5)
Monocytes Absolute: 550 cells/uL (ref 200–950)
NEUTROS ABS: 2950 {cells}/uL (ref 1500–7800)
Neutrophils Relative %: 59 %
PLATELETS: 331 10*3/uL (ref 140–400)
RBC: 4.75 MIL/uL (ref 3.80–5.10)
RDW: 13.7 % (ref 11.0–15.0)
WBC: 5 10*3/uL (ref 3.8–10.8)

## 2016-02-06 LAB — LIPID PANEL
CHOL/HDL RATIO: 2.2 ratio (ref <5.0)
Cholesterol: 209 mg/dL — ABNORMAL HIGH (ref <200)
HDL: 95 mg/dL (ref >50)
LDL CALC: 88 mg/dL (ref <100)
Triglycerides: 129 mg/dL (ref <150)
VLDL: 26 mg/dL (ref <30)

## 2016-02-06 LAB — TSH: TSH: 1.73 m[IU]/L

## 2016-02-07 ENCOUNTER — Other Ambulatory Visit (HOSPITAL_COMMUNITY)
Admission: RE | Admit: 2016-02-07 | Discharge: 2016-02-07 | Disposition: A | Discharge status: Home / Self Care | Payer: BLUE CROSS/BLUE SHIELD | Source: Ambulatory Visit | Attending: Internal Medicine | Admitting: Internal Medicine

## 2016-02-07 ENCOUNTER — Other Ambulatory Visit: Payer: Self-pay | Admitting: Internal Medicine

## 2016-02-07 ENCOUNTER — Ambulatory Visit (INDEPENDENT_AMBULATORY_CARE_PROVIDER_SITE_OTHER): Payer: BLUE CROSS/BLUE SHIELD | Admitting: Internal Medicine

## 2016-02-07 ENCOUNTER — Encounter: Payer: Self-pay | Admitting: Internal Medicine

## 2016-02-07 VITALS — BP 130/84 | HR 63 | Temp 97.8°F | Ht 66.0 in | Wt 165.0 lb

## 2016-02-07 DIAGNOSIS — E7800 Pure hypercholesterolemia, unspecified: Secondary | ICD-10-CM

## 2016-02-07 DIAGNOSIS — Z01419 Encounter for gynecological examination (general) (routine) without abnormal findings: Secondary | ICD-10-CM | POA: Diagnosis present

## 2016-02-07 DIAGNOSIS — R7302 Impaired glucose tolerance (oral): Secondary | ICD-10-CM

## 2016-02-07 DIAGNOSIS — E8881 Metabolic syndrome: Secondary | ICD-10-CM

## 2016-02-07 DIAGNOSIS — I10 Essential (primary) hypertension: Secondary | ICD-10-CM

## 2016-02-07 DIAGNOSIS — E039 Hypothyroidism, unspecified: Secondary | ICD-10-CM | POA: Diagnosis not present

## 2016-02-07 DIAGNOSIS — Z0000 Encounter for general adult medical examination without abnormal findings: Secondary | ICD-10-CM | POA: Diagnosis not present

## 2016-02-07 DIAGNOSIS — Z87440 Personal history of urinary (tract) infections: Secondary | ICD-10-CM | POA: Diagnosis not present

## 2016-02-07 DIAGNOSIS — M8580 Other specified disorders of bone density and structure, unspecified site: Secondary | ICD-10-CM

## 2016-02-07 LAB — POCT URINALYSIS DIPSTICK
BILIRUBIN UA: NEGATIVE
Blood, UA: NEGATIVE
GLUCOSE UA: NEGATIVE
Ketones, UA: NEGATIVE
LEUKOCYTES UA: NEGATIVE
NITRITE UA: NEGATIVE
PH UA: 7.5
Protein, UA: NEGATIVE
Spec Grav, UA: 1.01
Urobilinogen, UA: NEGATIVE

## 2016-02-07 LAB — MICROALBUMIN, URINE: Microalb, Ur: 0.9 mg/dL

## 2016-02-07 LAB — HEMOGLOBIN A1C
Hgb A1c MFr Bld: 5.5 % (ref <5.7)
Mean Plasma Glucose: 111 mg/dL

## 2016-02-10 ENCOUNTER — Other Ambulatory Visit: Payer: Self-pay | Admitting: Internal Medicine

## 2016-02-11 ENCOUNTER — Telehealth: Payer: Self-pay | Admitting: Internal Medicine

## 2016-02-11 LAB — CYTOLOGY - PAP: DIAGNOSIS: NEGATIVE

## 2016-02-11 NOTE — Telephone Encounter (Signed)
Patient aware of normal pap results. 

## 2016-02-11 NOTE — Telephone Encounter (Signed)
-----   Message from Margaree MackintoshMary J Baxley, MD sent at 02/11/2016  3:07 PM EST ----- Please call pt. Pap is normal.

## 2016-02-17 NOTE — Patient Instructions (Signed)
It was a pleasure to see you today. Please take care of yourself. Return in 6 months for office visit and lab work. Continue same medications.

## 2016-02-17 NOTE — Progress Notes (Signed)
   Subjective:    Patient ID: Brittany PettiesLinda S Mcdowell, female    DOB: [birth year], 65 y.o.   MRN: [id number]  HPI 65 year old White Female  in today for health maintenance exam and evaluation of medical issues including hypertension, hyperlipidemia, impaired glucose tolerance and hypothyroidism. Was treated early November for an acute UTI. Symptoms have resolved.   Past medical history: Patient had concussion May 2003. Bilateral tubal ligation December 1990. Had strep throat November 2014.  Tetanus immunization November 2013.  Social history: Does not smoke. Social alcohol consumption consisting of 4 ounces of wine daily. She is a widow. Husband died from complications of cancer. 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  Family history: Father died of complications of dementia with history of glucose intolerance and hyperlipidemia. Mother with history of arthritis. One brother and 3 sisters in good health.    Review of Systems  Constitutional: Negative.   HENT: Negative.        Objective:   Physical Exam  Constitutional: She is oriented to person, place, and time. She appears well-developed and well-nourished. No distress.  HENT:  Head: Normocephalic and atraumatic.  Right Ear: External ear normal.  Left Ear: External ear normal.  Mouth/Throat: Oropharynx is clear and moist.  Eyes: Conjunctivae and EOM are normal. Pupils are equal, round, and reactive to light. Right eye exhibits no discharge. Left eye exhibits no discharge. No scleral icterus.  Neck: Neck supple. No JVD present. No thyromegaly present.  Cardiovascular: Normal rate, regular rhythm, normal heart sounds and intact distal pulses.   No murmur heard. Pulmonary/Chest: Effort normal and breath sounds normal. No respiratory distress. She has no wheezes. She has no rales.  Breasts normal female without masses  Abdominal: Soft. Bowel sounds are normal. She exhibits no distension and no mass. There is no tenderness.  There is no rebound and no guarding.  Genitourinary:  Genitourinary Comments: Pap taken. Bimanual normal.  Musculoskeletal: Normal range of motion. She exhibits no edema.  Lymphadenopathy:    She has no cervical adenopathy.  Neurological: She is alert and oriented to person, place, and time. She has normal reflexes. No cranial nerve deficit. Coordination normal.  Skin: Skin is warm and dry. No rash noted. She is not diaphoretic. No erythema.  Psychiatric: She has a normal mood and affect. Her behavior is normal. Judgment and thought content normal.  Vitals reviewed.         Assessment & Plan:  Normal health maintenance exam  Hyperlipidemia stable on lipid-lowering medication. HDL cholesterol is 95. Total cholesterol 209. LDL cholesterol 88.  Hypertension-stable on current regimen  Hypothyroidism-stable on thyroid replacement. Continue same dose. TSH 1.73  Osteopenia-bone density study December 2016 consistent with osteopenia and is on Fosamax  Impaired glucose tolerance-hemoglobin A1c stable at 5.5%. Continue diet exercise and weight loss regimen.  Metabolic syndrome  Plan: Continue same medications return in 6 months.

## 2016-04-14 ENCOUNTER — Ambulatory Visit
Admission: RE | Admit: 2016-04-14 | Discharge: 2016-04-14 | Disposition: A | Discharge status: Home / Self Care | Payer: BLUE CROSS/BLUE SHIELD | Source: Ambulatory Visit | Attending: Internal Medicine | Admitting: Internal Medicine

## 2016-04-14 DIAGNOSIS — Z0000 Encounter for general adult medical examination without abnormal findings: Secondary | ICD-10-CM

## 2016-08-04 ENCOUNTER — Other Ambulatory Visit: Payer: BLUE CROSS/BLUE SHIELD | Admitting: Internal Medicine

## 2016-08-04 ENCOUNTER — Other Ambulatory Visit: Payer: Self-pay | Admitting: Internal Medicine

## 2016-08-04 DIAGNOSIS — R7302 Impaired glucose tolerance (oral): Secondary | ICD-10-CM

## 2016-08-04 DIAGNOSIS — E039 Hypothyroidism, unspecified: Secondary | ICD-10-CM

## 2016-08-04 DIAGNOSIS — E785 Hyperlipidemia, unspecified: Secondary | ICD-10-CM

## 2016-08-04 DIAGNOSIS — Z79899 Other long term (current) drug therapy: Secondary | ICD-10-CM

## 2016-08-04 LAB — LIPID PANEL
Cholesterol: 196 mg/dL (ref <200)
HDL: 88 mg/dL (ref >50)
LDL Cholesterol: 92 mg/dL (ref <100)
TRIGLYCERIDES: 78 mg/dL (ref <150)
Total CHOL/HDL Ratio: 2.2 Ratio (ref <5.0)
VLDL: 16 mg/dL (ref <30)

## 2016-08-04 LAB — HEPATIC FUNCTION PANEL
ALBUMIN: 4.2 g/dL (ref 3.6–5.1)
ALK PHOS: 52 U/L (ref 33–130)
ALT: 16 U/L (ref 6–29)
AST: 17 U/L (ref 10–35)
BILIRUBIN INDIRECT: 0.4 mg/dL (ref 0.2–1.2)
BILIRUBIN TOTAL: 0.6 mg/dL (ref 0.2–1.2)
Bilirubin, Direct: 0.2 mg/dL (ref <=0.2)
Total Protein: 6.7 g/dL (ref 6.1–8.1)

## 2016-08-04 LAB — TSH: TSH: 1.85 m[IU]/L

## 2016-08-05 LAB — HEMOGLOBIN A1C
Hgb A1c MFr Bld: 5.6 % (ref <5.7)
Mean Plasma Glucose: 114 mg/dL

## 2016-08-05 LAB — MICROALBUMIN / CREATININE URINE RATIO
Creatinine, Urine: 188 mg/dL (ref 20–320)
MICROALB UR: 0.6 mg/dL
Microalb Creat Ratio: 3 mcg/mg creat (ref <30)

## 2016-08-06 ENCOUNTER — Ambulatory Visit (INDEPENDENT_AMBULATORY_CARE_PROVIDER_SITE_OTHER): Payer: BLUE CROSS/BLUE SHIELD | Admitting: Internal Medicine

## 2016-08-06 ENCOUNTER — Encounter: Payer: Self-pay | Admitting: Internal Medicine

## 2016-08-06 VITALS — BP 134/82 | HR 57 | Temp 98.0°F | Ht 66.0 in | Wt 159.0 lb

## 2016-08-06 DIAGNOSIS — I10 Essential (primary) hypertension: Secondary | ICD-10-CM | POA: Diagnosis not present

## 2016-08-06 DIAGNOSIS — E039 Hypothyroidism, unspecified: Secondary | ICD-10-CM

## 2016-08-06 DIAGNOSIS — R7302 Impaired glucose tolerance (oral): Secondary | ICD-10-CM | POA: Diagnosis not present

## 2016-08-06 DIAGNOSIS — E7800 Pure hypercholesterolemia, unspecified: Secondary | ICD-10-CM

## 2016-08-06 DIAGNOSIS — E8881 Metabolic syndrome: Secondary | ICD-10-CM | POA: Diagnosis not present

## 2016-08-06 MED ORDER — LOSARTAN POTASSIUM 50 MG PO TABS: 50.0000 mg | ORAL_TABLET | Freq: Every day | ORAL | 1 refills | Status: DC

## 2016-08-06 MED ORDER — SIMVASTATIN 20 MG PO TABS: ORAL_TABLET | ORAL | 1 refills | Status: DC

## 2016-08-06 MED ORDER — ALENDRONATE SODIUM 70 MG PO TABS: ORAL_TABLET | ORAL | 5 refills | Status: DC

## 2016-08-06 MED ORDER — LEVOTHYROXINE SODIUM 75 MCG PO TABS: ORAL_TABLET | ORAL | 1 refills | Status: DC

## 2016-08-06 NOTE — Patient Instructions (Signed)
It was a pleasure to see today. Continue same medications and return in 6 months. 

## 2016-08-06 NOTE — Addendum Note (Signed)
Addended by: Yolande JollyLAWSON, Alyla Pietila on: 08/06/2016 10:28 AM   Modules accepted: Orders

## 2016-08-06 NOTE — Progress Notes (Signed)
   Subjective:    Patient ID: Brittany Mcdowell, female    DOB: 1950/10/31, 66 y.o.   MRN: [id number]  HPI 66 year old Female in today for six-month recheck. She has a history of hypertension, hyperlipidemia, impaired glucose tolerance and hypothyroidism. No new complaints. 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 Planning a road trip several states in the near future. Her son who is in [place] is moving to [place]. She slinking of downsizing. Has a large home and since her husband passed away doesn't need a lot of space anymore.    Review of Systems see above no new complaints     Objective:   Physical Exam Skin warm and dry. Nodes none. Neck is supple without thyromegaly JVD or carotid bruits. Chest clear. Cardiac exam regular rate and rhythm normal S1 and S2. Extremities without edema.       Assessment & Plan:  Lab work reviewed with her including lipid panel liver functions, hemoglobin A1c and TSH are all within normal limits. Her blood pressures under good control and she feels well. No changes in regimen and she'll return in 6 months for physical examination.

## 2017-01-17 ENCOUNTER — Other Ambulatory Visit: Payer: Self-pay | Admitting: Internal Medicine

## 2017-01-21 DIAGNOSIS — H608X3 Other otitis externa, bilateral: Secondary | ICD-10-CM | POA: Diagnosis not present

## 2017-01-21 DIAGNOSIS — H6043 Cholesteatoma of external ear, bilateral: Secondary | ICD-10-CM | POA: Diagnosis not present

## 2017-01-21 DIAGNOSIS — H6123 Impacted cerumen, bilateral: Secondary | ICD-10-CM | POA: Diagnosis not present

## 2017-01-23 ENCOUNTER — Other Ambulatory Visit: Payer: Self-pay | Admitting: Internal Medicine

## 2017-02-15 ENCOUNTER — Other Ambulatory Visit (INDEPENDENT_AMBULATORY_CARE_PROVIDER_SITE_OTHER): Payer: PPO | Admitting: Internal Medicine

## 2017-02-15 DIAGNOSIS — Z0000 Encounter for general adult medical examination without abnormal findings: Secondary | ICD-10-CM | POA: Diagnosis not present

## 2017-02-15 DIAGNOSIS — E039 Hypothyroidism, unspecified: Secondary | ICD-10-CM | POA: Diagnosis not present

## 2017-02-15 DIAGNOSIS — E785 Hyperlipidemia, unspecified: Secondary | ICD-10-CM

## 2017-02-15 DIAGNOSIS — I10 Essential (primary) hypertension: Secondary | ICD-10-CM | POA: Diagnosis not present

## 2017-02-15 DIAGNOSIS — R7302 Impaired glucose tolerance (oral): Secondary | ICD-10-CM | POA: Diagnosis not present

## 2017-02-16 ENCOUNTER — Ambulatory Visit (INDEPENDENT_AMBULATORY_CARE_PROVIDER_SITE_OTHER): Payer: PPO | Admitting: Internal Medicine

## 2017-02-16 ENCOUNTER — Encounter: Payer: Self-pay | Admitting: Internal Medicine

## 2017-02-16 ENCOUNTER — Other Ambulatory Visit (HOSPITAL_COMMUNITY)
Admission: RE | Admit: 2017-02-16 | Discharge: 2017-02-16 | Disposition: A | Discharge status: Home / Self Care | Payer: PPO | Source: Ambulatory Visit | Attending: Internal Medicine | Admitting: Internal Medicine

## 2017-02-16 VITALS — BP 120/80 | HR 70 | Ht 65.5 in | Wt 164.0 lb

## 2017-02-16 DIAGNOSIS — M25562 Pain in left knee: Secondary | ICD-10-CM

## 2017-02-16 DIAGNOSIS — Z124 Encounter for screening for malignant neoplasm of cervix: Secondary | ICD-10-CM | POA: Diagnosis not present

## 2017-02-16 DIAGNOSIS — M8580 Other specified disorders of bone density and structure, unspecified site: Secondary | ICD-10-CM | POA: Diagnosis not present

## 2017-02-16 DIAGNOSIS — R7302 Impaired glucose tolerance (oral): Secondary | ICD-10-CM | POA: Diagnosis not present

## 2017-02-16 DIAGNOSIS — I10 Essential (primary) hypertension: Secondary | ICD-10-CM | POA: Diagnosis not present

## 2017-02-16 DIAGNOSIS — M25551 Pain in right hip: Secondary | ICD-10-CM | POA: Diagnosis not present

## 2017-02-16 DIAGNOSIS — E039 Hypothyroidism, unspecified: Secondary | ICD-10-CM | POA: Diagnosis not present

## 2017-02-16 DIAGNOSIS — Z23 Encounter for immunization: Secondary | ICD-10-CM

## 2017-02-16 DIAGNOSIS — E8881 Metabolic syndrome: Secondary | ICD-10-CM | POA: Diagnosis not present

## 2017-02-16 DIAGNOSIS — E7800 Pure hypercholesterolemia, unspecified: Secondary | ICD-10-CM

## 2017-02-16 DIAGNOSIS — Z0000 Encounter for general adult medical examination without abnormal findings: Secondary | ICD-10-CM

## 2017-02-16 LAB — CBC WITH DIFFERENTIAL/PLATELET
BASOS ABS: 32 {cells}/uL (ref 0–200)
Basophils Relative: 0.6 %
EOS ABS: 101 {cells}/uL (ref 15–500)
Eosinophils Relative: 1.9 %
HCT: 42.7 % (ref 35.0–45.0)
HEMOGLOBIN: 14.5 g/dL (ref 11.7–15.5)
Lymphs Abs: 1256 cells/uL (ref 850–3900)
MCH: 30.3 pg (ref 27.0–33.0)
MCHC: 34 g/dL (ref 32.0–36.0)
MCV: 89.3 fL (ref 80.0–100.0)
MPV: 9.7 fL (ref 7.5–12.5)
Monocytes Relative: 10.6 %
NEUTROS PCT: 63.2 %
Neutro Abs: 3350 cells/uL (ref 1500–7800)
Platelets: 347 10*3/uL (ref 140–400)
RBC: 4.78 10*6/uL (ref 3.80–5.10)
RDW: 12.8 % (ref 11.0–15.0)
TOTAL LYMPHOCYTE: 23.7 %
WBC mixed population: 562 cells/uL (ref 200–950)
WBC: 5.3 10*3/uL (ref 3.8–10.8)

## 2017-02-16 LAB — POCT URINALYSIS DIPSTICK
BILIRUBIN UA: NEGATIVE
GLUCOSE UA: NEGATIVE
Ketones, UA: NEGATIVE
Leukocytes, UA: NEGATIVE
Nitrite, UA: NEGATIVE
Protein, UA: NEGATIVE
RBC UA: NEGATIVE
SPEC GRAV UA: 1.02 (ref 1.010–1.025)
Urobilinogen, UA: 0.2 E.U./dL
pH, UA: 6.5 (ref 5.0–8.0)

## 2017-02-16 LAB — COMPLETE METABOLIC PANEL WITH GFR
AG Ratio: 1.6 (calc) (ref 1.0–2.5)
ALBUMIN MSPROF: 4.2 g/dL (ref 3.6–5.1)
ALKALINE PHOSPHATASE (APISO): 59 U/L (ref 33–130)
ALT: 21 U/L (ref 6–29)
AST: 16 U/L (ref 10–35)
BILIRUBIN TOTAL: 0.6 mg/dL (ref 0.2–1.2)
BUN: 15 mg/dL (ref 7–25)
CHLORIDE: 103 mmol/L (ref 98–110)
CO2: 26 mmol/L (ref 20–32)
Calcium: 9.3 mg/dL (ref 8.6–10.4)
Creat: 0.7 mg/dL (ref 0.50–0.99)
GFR, Est African American: 105 mL/min/{1.73_m2} (ref >=60)
GFR, Est Non African American: 91 mL/min/{1.73_m2} (ref >=60)
Globulin: 2.6 g/dL (calc) (ref 1.9–3.7)
Glucose, Bld: 95 mg/dL (ref 65–99)
Potassium: 4.2 mmol/L (ref 3.5–5.3)
SODIUM: 139 mmol/L (ref 135–146)
Total Protein: 6.8 g/dL (ref 6.1–8.1)

## 2017-02-16 LAB — LIPID PANEL
CHOLESTEROL: 231 mg/dL — AB (ref <200)
HDL: 84 mg/dL (ref >50)
LDL CHOLESTEROL (CALC): 115 mg/dL — AB
Non-HDL Cholesterol (Calc): 147 mg/dL (calc) — ABNORMAL HIGH (ref <130)
TRIGLYCERIDES: 197 mg/dL — AB (ref <150)
Total CHOL/HDL Ratio: 2.8 (calc) (ref <5.0)

## 2017-02-16 LAB — TSH: TSH: 3.33 mIU/L (ref 0.40–4.50)

## 2017-02-16 LAB — MICROALBUMIN / CREATININE URINE RATIO
Creatinine, Urine: 179 mg/dL (ref 20–275)
MICROALB UR: 0.8 mg/dL
Microalb Creat Ratio: 4 mcg/mg creat (ref <30)

## 2017-02-16 MED ORDER — LEVOTHYROXINE SODIUM 75 MCG PO TABS: ORAL_TABLET | ORAL | 2 refills | Status: DC

## 2017-02-16 NOTE — Progress Notes (Signed)
Subjective:    Patient ID: Brittany Mcdowell, female    DOB: 04-30-1950, 66 y.o.   MRN: [id number]  HPI 66 year old Female in today for Welcome to Medicare physical examination.  She has a history of hypertension, hyperlipidemia, impaired glucose tolerance, hypothyroidism, metabolic syndrome.  History of UTI November 2017.  Past medical history: She had a concussion in May 2003.  Bilateral tubal ligation December 9 strep throat November 2014.  Tetanus immunization November 2013.  Social history: Does not smoke.  Social alcohol consumption consisted of 4 ounces of wine daily.  She is a widow.  Husband died from complications of cancer.  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  3 adult sons.  Patient is considering a move to [place] to be with her mother and family in the near future.  Patient is a native of [place].  Family history: Father died of complications of dementia with history of glucose intolerance and hyperlipidemia.  Mother living in [place] with history of osteoarthritis.  One brother and 3 sisters in good health.      Review of Systems  Constitutional: Negative.   Respiratory: Negative.   Cardiovascular: Negative.   Gastrointestinal: Negative.   Genitourinary: Negative.   Musculoskeletal:       Has developed issues with right hip and left knee.  She would like to see orthopedist.  These have become intolerably painful.  Neurological: Negative.   Psychiatric/Behavioral: Negative.        Objective:   Physical Exam  Constitutional: She is oriented to person, place, and time. She appears well-developed and well-nourished. No distress.  HENT:  Head: Normocephalic and atraumatic.  Right Ear: External ear normal.  Left Ear: External ear normal.  Eyes: Conjunctivae and EOM are normal. Pupils are equal, round, and reactive to light. Right eye exhibits no discharge. Left eye exhibits no discharge.  Neck: Normal range of motion. Neck supple. No JVD present. No thyromegaly  present.  Cardiovascular: Normal rate, regular rhythm and normal heart sounds.  No murmur heard. Pulmonary/Chest: Effort normal and breath sounds normal. No respiratory distress. She has no wheezes. She has no rales.  Abdominal: She exhibits no distension and no mass. There is no tenderness. There is no rebound and no guarding.  Genitourinary:  Genitourinary Comments: Pap taken.  Bimanual normal  Musculoskeletal: She exhibits no edema.  Lymphadenopathy:    She has no cervical adenopathy.  Neurological: She is alert and oriented to person, place, and time. She has normal reflexes. No cranial nerve deficit. Coordination normal.  Skin: Skin is warm and dry. No rash noted. She is not diaphoretic.  Psychiatric: She has a normal mood and affect. Her behavior is normal. Judgment and thought content normal.  Vitals reviewed.         Assessment & Plan:  Hypertension under good control  Hyperlipidemia total cholesterol 231, triglycerides 197 and LDL cholesterol 115.  6 months ago these were normal.  May have over eaten at Thanksgiving.  Recheck in 6 months  Hypothyroidism- TSH is stable on current dose of thyroid replacement  Osteopenia treated with Fosamax.  Had bone density study December 2016 and should be repeated later this year  Impaired glucose tolerance-hemoglobin A1c normal at 5.6%  Metabolic syndrome  Plan: Continue same medications and return in 6 months.  Prevnar 13 recommended.  Subjective:   Patient presents for Medicare Annual/Subsequent preventive examination.  Review Past Medical/Family/Social: See above  Risk Factors  Current exercise habits: Walks Dietary issues  discussed: Low-fat low carbohydrate  Cardiac risk factors: Hyperlipidemia, impaired glucose tolerance  Depression Screen  (Note: if answer to either of the following is "Yes", a more complete depression screening is indicated)   Over the past two weeks, have you felt down, depressed or hopeless?   Sometimes Over the past two weeks, have you felt little interest or pleasure in doing things? No Have you lost interest or pleasure in daily life?  At times Do you often feel hopeless?  Yes when I think about cleaning out my house Do you cry easily over simple problems? No  Patient still misses her husband and is grieving.  I do not think she needs antidepressant medication  Activities of Daily Living  In your present state of health, do you have any difficulty performing the following activities?:   Driving? No  Managing money? No  Feeding yourself? No  Getting from bed to chair? No  Climbing a flight of stairs?  Yes because of a sore hip Preparing food and eating?: No  Bathing or showering? No  Getting dressed: No  Getting to the toilet? No  Using the toilet:No  Moving around from place to place: No  In the past year have you fallen or had a near fall?:  Yes once on the ice this past winter Are you sexually active? No  Do you have more than one partner? No   Hearing Difficulties: No  Do you often ask people to speak up or repeat themselves? No  Do you experience ringing or noises in your ears? No  Do you have difficulty understanding soft or whispered voices? No  Do you feel that you have a problem with memory? No Do you often misplace items? No    Home Safety:  Do you have a smoke alarm at your residence? Yes Do you have grab bars in the bathroom?  No Do you have throw rugs in your house?  Yes   Cognitive Testing  Alert? Yes Normal Appearance?Yes  Oriented to person? Yes Place? Yes  Time? Yes  Recall of three objects? Yes  Can perform simple calculations? Yes  Displays appropriate judgment?Yes  Can read the correct time from a watch face?Yes   List the Names of Other Physician/Practitioners you currently use:  See referral list for the physicians patient is currently seeing.     Review of Systems:   Objective:     General appearance: Appears stated age  and mildly obese  Head: Normocephalic, without obvious abnormality, atraumatic  Eyes: conj clear, EOMi PEERLA  Ears: normal TM's and external ear canals both ears  Nose: Nares normal. Septum midline. Mucosa normal. No drainage or sinus tenderness.  Throat: lips, mucosa, and tongue normal; teeth and gums normal  Neck: no adenopathy, no carotid bruit, no JVD, supple, symmetrical, trachea midline and thyroid not enlarged, symmetric, no tenderness/mass/nodules  No CVA tenderness.  Lungs: clear to auscultation bilaterally  Breasts: normal appearance, no masses or tenderness, top of the pacemaker on left upper chest. Incision well-healed. It is tender.  Heart: regular rate and rhythm, S1, S2 normal, no murmur, click, rub or gallop  Abdomen: soft, non-tender; bowel sounds normal; no masses, no organomegaly  Musculoskeletal: ROM normal in all joints, no crepitus, no deformity, Normal muscle strengthen. Back  is symmetric, no curvature. Skin: Skin color, texture, turgor normal. No rashes or lesions  Lymph nodes: Cervical, supraclavicular, and axillary nodes normal.  Neurologic: CN 2 -12 Normal, Normal symmetric reflexes. Normal coordination and gait  Psych: Alert & Oriented x 3, Mood appear stable.    Assessment:    Annual wellness medicare exam   Plan:    During the course of the visit the patient was educated and counseled about appropriate screening and preventive services including:   Annual mammogram  Bone density study later this year       Patient Instructions (the written plan) was given to the patient.  Medicare Attestation  I have personally reviewed:  The patient's medical and social history  Their use of alcohol, tobacco or illicit drugs  Their current medications and supplements  The patient's functional ability including ADLs,fall risks, home safety risks, cognitive, and hearing and visual impairment  Diet and physical activities  Evidence for depression or mood  disorders  The patient's weight, height, BMI, and visual acuity have been recorded in the chart. I have made referrals, counseling, and provided education to the patient based on review of the above and I have provided the patient with a written personalized care plan for preventive services.

## 2017-02-19 LAB — CYTOLOGY - PAP
Adequacy: ABSENT
Diagnosis: NEGATIVE
HPV: DETECTED — AB

## 2017-02-19 MED FILL — SHINGRIX VIAL KIT: 50 | 1 days supply | Qty: 1 | Fill #0

## 2017-03-22 ENCOUNTER — Other Ambulatory Visit: Payer: Self-pay | Admitting: Internal Medicine

## 2017-03-22 DIAGNOSIS — Z1231 Encounter for screening mammogram for malignant neoplasm of breast: Secondary | ICD-10-CM

## 2017-04-02 DIAGNOSIS — M25551 Pain in right hip: Secondary | ICD-10-CM | POA: Diagnosis not present

## 2017-04-02 DIAGNOSIS — M1611 Unilateral primary osteoarthritis, right hip: Secondary | ICD-10-CM | POA: Diagnosis not present

## 2017-04-02 DIAGNOSIS — M7061 Trochanteric bursitis, right hip: Secondary | ICD-10-CM | POA: Diagnosis not present

## 2017-04-15 ENCOUNTER — Ambulatory Visit
Admission: RE | Admit: 2017-04-15 | Discharge: 2017-04-15 | Disposition: A | Discharge status: Home / Self Care | Payer: PPO | Source: Ambulatory Visit | Attending: Internal Medicine | Admitting: Internal Medicine

## 2017-04-15 DIAGNOSIS — Z1231 Encounter for screening mammogram for malignant neoplasm of breast: Secondary | ICD-10-CM

## 2017-04-15 DIAGNOSIS — M810 Age-related osteoporosis without current pathological fracture: Secondary | ICD-10-CM | POA: Diagnosis not present

## 2017-04-15 DIAGNOSIS — M8580 Other specified disorders of bone density and structure, unspecified site: Secondary | ICD-10-CM

## 2017-04-15 DIAGNOSIS — Z780 Asymptomatic menopausal state: Secondary | ICD-10-CM | POA: Diagnosis not present

## 2017-04-22 MED FILL — SHINGRIX VIAL KIT: 50 | 1 days supply | Qty: 1 | Fill #1

## 2017-04-26 ENCOUNTER — Encounter: Payer: Self-pay | Admitting: Internal Medicine

## 2017-04-26 ENCOUNTER — Ambulatory Visit (INDEPENDENT_AMBULATORY_CARE_PROVIDER_SITE_OTHER): Payer: PPO | Admitting: Internal Medicine

## 2017-04-26 VITALS — BP 140/90 | HR 73 | Ht 65.5 in | Wt 163.0 lb

## 2017-04-26 DIAGNOSIS — M810 Age-related osteoporosis without current pathological fracture: Secondary | ICD-10-CM

## 2017-04-26 NOTE — Progress Notes (Signed)
   Subjective:    Patient ID: Brittany Mcdowell, female    DOB: 01/03/1951, 67 y.o.   MRN: [id number]  HPI 67 year old female in today at my request to discuss recent bone density results.  She had bone density study in 2016 showing right femoral neck T score to be -2.0.  AP spine was -1.4  Bone density study done in January this year showed T score in the right femoral neck of -2.5 consistent with osteoporosis.  Left forearm radius -1.3.  Lumbar spine was not utilized due to advanced degenerative change.  She was started on Fosamax in December 2016.  Obviously has not prevented progression of osteopenia/osteoporosis if she has been compliant.  Review of Systems     Objective:   Physical Exam  Not examined but spent 20 minutes speaking with her about this today.      Assessment & Plan:  Osteoporosis  Plan: Reclast and Prolia options discussed with patient.  She will consider.  She is considering a move out of state in the near future.

## 2017-05-16 NOTE — Patient Instructions (Signed)
Reclast and Prolia options discussed with patient.  She will consider.

## 2017-06-29 ENCOUNTER — Ambulatory Visit (INDEPENDENT_AMBULATORY_CARE_PROVIDER_SITE_OTHER): Payer: PPO | Admitting: Internal Medicine

## 2017-06-29 ENCOUNTER — Telehealth: Payer: Self-pay | Admitting: Internal Medicine

## 2017-06-29 VITALS — BP 140/90 | HR 86 | Temp 98.5°F

## 2017-06-29 DIAGNOSIS — R300 Dysuria: Secondary | ICD-10-CM

## 2017-06-29 DIAGNOSIS — R8290 Unspecified abnormal findings in urine: Secondary | ICD-10-CM

## 2017-06-29 DIAGNOSIS — R350 Frequency of micturition: Secondary | ICD-10-CM | POA: Diagnosis not present

## 2017-06-29 LAB — POCT URINALYSIS DIPSTICK
Appearance: ABNORMAL
Bilirubin, UA: NEGATIVE
Glucose, UA: NEGATIVE
KETONES UA: NEGATIVE
NITRITE UA: NEGATIVE
ODOR: ABNORMAL
PROTEIN UA: NEGATIVE
Spec Grav, UA: <=1.005 — AB (ref 1.010–1.025)
Urobilinogen, UA: 0.2 E.U./dL
pH, UA: 7 (ref 5.0–8.0)

## 2017-06-29 MED ORDER — CIPROFLOXACIN HCL 500 MG PO TABS: 500.0000 mg | ORAL_TABLET | Freq: Two times a day (BID) | ORAL | 0 refills | Status: DC

## 2017-06-29 NOTE — Telephone Encounter (Signed)
I called pt. Urine results reviewed and Cipro called in for her pending culture results.

## 2017-06-29 NOTE — Patient Instructions (Addendum)
Cipro 500 mg bid x 10 days called in pending culture

## 2017-06-29 NOTE — Progress Notes (Signed)
   Subjective:    Patient ID: Brittany Mcdowell, female    DOB: March 13, 1951, 67 y.o.   MRN: [id number]  HPI Patient walked in without appointment before I arrived wanting urine checked for UTI. Culture will be sent.Pt not see by M.D.c/o pain pressure to CMA when urinating. Noticed some blood per CMA. Said she could not wait as she is in tax season. Works as an Airline pilotaccountant. Wants something called in.  Last UTI was Nov 2017 traeted with Cipro  Review of Systems     Objective:   Physical Exam Afebrile- not seen by MD       Assessment & Plan:  Dysuria  Plan:Cipro 500 mg bid x 10 days

## 2017-06-30 LAB — URINE CULTURE
MICRO NUMBER:: 90435950
Result:: NO GROWTH
SPECIMEN QUALITY:: ADEQUATE

## 2017-07-02 ENCOUNTER — Other Ambulatory Visit: Payer: Self-pay | Admitting: Internal Medicine

## 2017-07-06 DIAGNOSIS — H6123 Impacted cerumen, bilateral: Secondary | ICD-10-CM | POA: Diagnosis not present

## 2017-07-06 DIAGNOSIS — H6043 Cholesteatoma of external ear, bilateral: Secondary | ICD-10-CM | POA: Diagnosis not present

## 2017-07-06 DIAGNOSIS — H608X3 Other otitis externa, bilateral: Secondary | ICD-10-CM | POA: Diagnosis not present

## 2017-07-28 ENCOUNTER — Other Ambulatory Visit: Payer: Self-pay | Admitting: Internal Medicine

## 2017-07-28 DIAGNOSIS — E039 Hypothyroidism, unspecified: Secondary | ICD-10-CM

## 2017-07-28 DIAGNOSIS — E785 Hyperlipidemia, unspecified: Secondary | ICD-10-CM

## 2017-07-29 ENCOUNTER — Other Ambulatory Visit: Payer: Self-pay | Admitting: Internal Medicine

## 2017-08-10 ENCOUNTER — Other Ambulatory Visit: Payer: PPO | Admitting: Internal Medicine

## 2017-08-10 DIAGNOSIS — E785 Hyperlipidemia, unspecified: Secondary | ICD-10-CM

## 2017-08-10 DIAGNOSIS — E039 Hypothyroidism, unspecified: Secondary | ICD-10-CM

## 2017-08-10 LAB — LIPID PANEL
CHOL/HDL RATIO: 3.3 (calc) (ref <5.0)
Cholesterol: 217 mg/dL — ABNORMAL HIGH (ref <200)
HDL: 66 mg/dL (ref >50)
LDL CHOLESTEROL (CALC): 129 mg/dL — AB
Non-HDL Cholesterol (Calc): 151 mg/dL (calc) — ABNORMAL HIGH (ref <130)
Triglycerides: 117 mg/dL (ref <150)

## 2017-08-10 LAB — TSH: TSH: 0.93 m[IU]/L (ref 0.40–4.50)

## 2017-08-12 ENCOUNTER — Ambulatory Visit: Payer: PPO | Admitting: Internal Medicine

## 2017-08-19 ENCOUNTER — Ambulatory Visit (INDEPENDENT_AMBULATORY_CARE_PROVIDER_SITE_OTHER): Payer: PPO | Admitting: Internal Medicine

## 2017-08-19 ENCOUNTER — Encounter: Payer: Self-pay | Admitting: Internal Medicine

## 2017-08-19 VITALS — BP 130/90 | HR 64 | Ht 65.5 in | Wt 158.0 lb

## 2017-08-19 DIAGNOSIS — E039 Hypothyroidism, unspecified: Secondary | ICD-10-CM

## 2017-08-19 DIAGNOSIS — M8580 Other specified disorders of bone density and structure, unspecified site: Secondary | ICD-10-CM

## 2017-08-19 DIAGNOSIS — R7302 Impaired glucose tolerance (oral): Secondary | ICD-10-CM

## 2017-08-19 DIAGNOSIS — I10 Essential (primary) hypertension: Secondary | ICD-10-CM

## 2017-08-19 DIAGNOSIS — E785 Hyperlipidemia, unspecified: Secondary | ICD-10-CM | POA: Diagnosis not present

## 2017-08-19 NOTE — Patient Instructions (Signed)
It was a pleasure to see you today.  Continue same medications and follow-up Fall 2019.  Watch diet and try to get more exercise.

## 2017-08-19 NOTE — Progress Notes (Signed)
   Subjective:    Patient ID: Brittany Mcdowell, female    DOB: 06/09/1950, 67 y.o.   MRN: [id number]  HPI She is here today for 31-month follow-up.  We failed to draw hemoglobin A1c when she was here earlier this week for lab work.  This was drawn today.  She has a history of hypertension hyperlipidemia hypothyroidism and impaired glucose tolerance.  Her TSH is normal.  She is on statin medication.  Her total cholesterol has improved from 2 31 to 217.  Triglycerides have improved from 1 97-1 17.  LDL however has gone up from 115 to 129. She is on Fosamax for osteopenia.  Blood pressure stable on current regimen.  She is still contemplating a move back to her hometown in the [place].   Review of Systems see above     Objective:   Physical Exam        Assessment & Plan:  Impaired glucose tolerance-Hemoglobin A1c drawn and pending today  Hyperlipidemia-I do not know why  LDL cholesterol is increased at the present time.  Her HDL has actually gone down.  Her total cholesterol went down but her LDL went up slightly  Osteopenia-continue Fosamax.  Continue vitamin D supplement.  Essential hypertension-continue current medication  Plan: She thought about moving in October 2019.  That would be before her physical exam is due.  She will let me know if she  would like to have an office visit prior to her move if she decides to go ahead with it.  Otherwise she will be due for physical exam late November.

## 2017-08-20 LAB — HEMOGLOBIN A1C
HEMOGLOBIN A1C: 5.7 %{Hb} — AB (ref <5.7)
MEAN PLASMA GLUCOSE: 117 (calc)
eAG (mmol/L): 6.5 (calc)

## 2017-10-28 ENCOUNTER — Other Ambulatory Visit: Payer: Self-pay | Admitting: Internal Medicine

## 2017-12-26 ENCOUNTER — Other Ambulatory Visit: Payer: Self-pay | Admitting: Internal Medicine

## 2018-01-25 ENCOUNTER — Other Ambulatory Visit: Payer: Self-pay | Admitting: Internal Medicine

## 2018-02-03 DIAGNOSIS — H6043 Cholesteatoma of external ear, bilateral: Secondary | ICD-10-CM | POA: Diagnosis not present

## 2018-02-03 DIAGNOSIS — H6123 Impacted cerumen, bilateral: Secondary | ICD-10-CM | POA: Diagnosis not present

## 2018-02-03 DIAGNOSIS — H608X3 Other otitis externa, bilateral: Secondary | ICD-10-CM | POA: Diagnosis not present

## 2018-02-08 ENCOUNTER — Other Ambulatory Visit: Payer: Self-pay | Admitting: Internal Medicine

## 2018-02-08 DIAGNOSIS — E039 Hypothyroidism, unspecified: Secondary | ICD-10-CM

## 2018-02-08 DIAGNOSIS — M8580 Other specified disorders of bone density and structure, unspecified site: Secondary | ICD-10-CM

## 2018-02-08 DIAGNOSIS — R7302 Impaired glucose tolerance (oral): Secondary | ICD-10-CM

## 2018-02-08 DIAGNOSIS — I10 Essential (primary) hypertension: Secondary | ICD-10-CM

## 2018-02-08 DIAGNOSIS — E785 Hyperlipidemia, unspecified: Secondary | ICD-10-CM

## 2018-02-14 ENCOUNTER — Other Ambulatory Visit (INDEPENDENT_AMBULATORY_CARE_PROVIDER_SITE_OTHER): Payer: PPO | Admitting: Internal Medicine

## 2018-02-14 DIAGNOSIS — M8580 Other specified disorders of bone density and structure, unspecified site: Secondary | ICD-10-CM

## 2018-02-14 DIAGNOSIS — E785 Hyperlipidemia, unspecified: Secondary | ICD-10-CM | POA: Diagnosis not present

## 2018-02-14 DIAGNOSIS — E039 Hypothyroidism, unspecified: Secondary | ICD-10-CM

## 2018-02-14 DIAGNOSIS — I10 Essential (primary) hypertension: Secondary | ICD-10-CM | POA: Diagnosis not present

## 2018-02-14 DIAGNOSIS — Z0000 Encounter for general adult medical examination without abnormal findings: Secondary | ICD-10-CM | POA: Diagnosis not present

## 2018-02-14 DIAGNOSIS — R7302 Impaired glucose tolerance (oral): Secondary | ICD-10-CM | POA: Diagnosis not present

## 2018-02-14 LAB — POCT URINALYSIS DIPSTICK
APPEARANCE: NEGATIVE
BILIRUBIN UA: NEGATIVE
GLUCOSE UA: NEGATIVE
Ketones, UA: NEGATIVE
LEUKOCYTES UA: NEGATIVE
Nitrite, UA: NEGATIVE
ODOR: NEGATIVE
Protein, UA: NEGATIVE
RBC UA: NEGATIVE
Spec Grav, UA: 1.01 (ref 1.010–1.025)
UROBILINOGEN UA: 0.2 U/dL
pH, UA: 7.5 (ref 5.0–8.0)

## 2018-02-14 NOTE — Addendum Note (Signed)
Addended by: Gregery NaVALENCIA, Alea Ryer P on: 02/14/2018 09:35 AM   Modules accepted: Orders

## 2018-02-15 ENCOUNTER — Other Ambulatory Visit: Payer: PPO | Admitting: Internal Medicine

## 2018-02-15 LAB — CBC WITH DIFFERENTIAL/PLATELET
BASOS ABS: 41 {cells}/uL (ref 0–200)
Basophils Relative: 0.7 %
EOS PCT: 2 %
Eosinophils Absolute: 118 cells/uL (ref 15–500)
HCT: 42.9 % (ref 35.0–45.0)
HEMOGLOBIN: 14.4 g/dL (ref 11.7–15.5)
Lymphs Abs: 1475 cells/uL (ref 850–3900)
MCH: 29.9 pg (ref 27.0–33.0)
MCHC: 33.6 g/dL (ref 32.0–36.0)
MCV: 89 fL (ref 80.0–100.0)
MONOS PCT: 10.5 %
MPV: 10.3 fL (ref 7.5–12.5)
NEUTROS ABS: 3646 {cells}/uL (ref 1500–7800)
Neutrophils Relative %: 61.8 %
PLATELETS: 321 10*3/uL (ref 140–400)
RBC: 4.82 10*6/uL (ref 3.80–5.10)
RDW: 13 % (ref 11.0–15.0)
TOTAL LYMPHOCYTE: 25 %
WBC mixed population: 620 cells/uL (ref 200–950)
WBC: 5.9 10*3/uL (ref 3.8–10.8)

## 2018-02-15 LAB — COMPLETE METABOLIC PANEL WITH GFR
AG RATIO: 1.8 (calc) (ref 1.0–2.5)
ALT: 24 U/L (ref 6–29)
AST: 17 U/L (ref 10–35)
Albumin: 4.2 g/dL (ref 3.6–5.1)
Alkaline phosphatase (APISO): 65 U/L (ref 33–130)
BILIRUBIN TOTAL: 0.5 mg/dL (ref 0.2–1.2)
BUN: 16 mg/dL (ref 7–25)
CO2: 28 mmol/L (ref 20–32)
Calcium: 9.7 mg/dL (ref 8.6–10.4)
Chloride: 102 mmol/L (ref 98–110)
Creat: 0.72 mg/dL (ref 0.50–0.99)
GFR, EST NON AFRICAN AMERICAN: 87 mL/min/{1.73_m2} (ref >=60)
GFR, Est African American: 101 mL/min/{1.73_m2} (ref >=60)
GLOBULIN: 2.3 g/dL (ref 1.9–3.7)
Glucose, Bld: 96 mg/dL (ref 65–99)
POTASSIUM: 4.7 mmol/L (ref 3.5–5.3)
SODIUM: 139 mmol/L (ref 135–146)
Total Protein: 6.5 g/dL (ref 6.1–8.1)

## 2018-02-15 LAB — MICROALBUMIN / CREATININE URINE RATIO
Creatinine, Urine: 75 mg/dL (ref 20–275)
MICROALB UR: 0.6 mg/dL
Microalb Creat Ratio: 8 mcg/mg creat (ref <30)

## 2018-02-15 LAB — HEMOGLOBIN A1C
HEMOGLOBIN A1C: 5.8 %{Hb} — AB (ref <5.7)
Mean Plasma Glucose: 120 (calc)
eAG (mmol/L): 6.6 (calc)

## 2018-02-15 LAB — LIPID PANEL
CHOL/HDL RATIO: 3.3 (calc) (ref <5.0)
Cholesterol: 249 mg/dL — ABNORMAL HIGH (ref <200)
HDL: 75 mg/dL (ref >50)
LDL Cholesterol (Calc): 142 mg/dL (calc) — ABNORMAL HIGH
NON-HDL CHOLESTEROL (CALC): 174 mg/dL — AB (ref <130)
TRIGLYCERIDES: 182 mg/dL — AB (ref <150)

## 2018-02-15 LAB — TSH: TSH: 4.72 m[IU]/L — AB (ref 0.40–4.50)

## 2018-02-22 ENCOUNTER — Ambulatory Visit (INDEPENDENT_AMBULATORY_CARE_PROVIDER_SITE_OTHER): Payer: PPO | Admitting: Internal Medicine

## 2018-02-22 ENCOUNTER — Encounter: Payer: Self-pay | Admitting: Internal Medicine

## 2018-02-22 VITALS — BP 120/90 | HR 66 | Ht 65.5 in | Wt 160.0 lb

## 2018-02-22 DIAGNOSIS — M25551 Pain in right hip: Secondary | ICD-10-CM | POA: Diagnosis not present

## 2018-02-22 DIAGNOSIS — Z0000 Encounter for general adult medical examination without abnormal findings: Secondary | ICD-10-CM

## 2018-02-22 DIAGNOSIS — R7302 Impaired glucose tolerance (oral): Secondary | ICD-10-CM

## 2018-02-22 DIAGNOSIS — R7989 Other specified abnormal findings of blood chemistry: Secondary | ICD-10-CM | POA: Diagnosis not present

## 2018-02-22 DIAGNOSIS — Z23 Encounter for immunization: Secondary | ICD-10-CM | POA: Diagnosis not present

## 2018-02-22 DIAGNOSIS — M810 Age-related osteoporosis without current pathological fracture: Secondary | ICD-10-CM | POA: Diagnosis not present

## 2018-02-22 DIAGNOSIS — I10 Essential (primary) hypertension: Secondary | ICD-10-CM | POA: Diagnosis not present

## 2018-02-22 DIAGNOSIS — E8881 Metabolic syndrome: Secondary | ICD-10-CM

## 2018-02-22 DIAGNOSIS — E7800 Pure hypercholesterolemia, unspecified: Secondary | ICD-10-CM | POA: Diagnosis not present

## 2018-02-22 DIAGNOSIS — M8580 Other specified disorders of bone density and structure, unspecified site: Secondary | ICD-10-CM

## 2018-02-22 NOTE — Progress Notes (Signed)
Subjective:    Patient ID: Brittany Mcdowell, female    DOB: [birth year], 67 y.o.   MRN: [id number]  HPI  67 year old Female for health maintenance exam, Medicare wellness, and evaluation of medical issues.  Moving to  [place] this coming Sunday.  She will find a physician once she gets settled there.  Mother and family are there.  She is a native of [place].  She has a history of hypertension, hyperlipidemia, impaired glucose tolerance, hypothyroidism, metabolic syndrome.  Urinary tract infection November 2017  History past medical history: She had a concussion May 2003. Hx of  Bilateral tubal ligation.  Strep throat November 2014.  Tetanus immunization November 2013.  Social history: Does not smoke.  Social alcohol consumption consisting of 4 ounces of wine daily.  She is a widow.  Her husband died from complications of cancer.  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  3 adult sons.  Family history: Father died of complications of dementia with history of glucose intolerance and hyperlipidemia.  One brother and 3 sisters in good health.  Mother living in [place] with history of osteoarthritis.         Review of Systems  Constitutional: Negative.   Respiratory: Negative.   Cardiovascular: Negative.   Gastrointestinal: Negative.   Musculoskeletal:       History of right hip and left knee pain and has seen orthopedist  Neurological: Negative.   Psychiatric/Behavioral: Negative.        Objective:   Physical Exam Vitals signs reviewed.  Constitutional:      General: She is not in acute distress.    Appearance: Normal appearance.  HENT:     Head: Normocephalic and atraumatic.     Right Ear: Tympanic membrane normal.     Left Ear: Tympanic membrane normal.     Nose: Nose normal.     Mouth/Throat:     Pharynx: Oropharynx is clear.  Eyes:     General:        Right eye: No discharge.        Left eye: No discharge.     Conjunctiva/sclera: Conjunctivae normal.  Neck:   Musculoskeletal: Neck supple. No neck rigidity.  Cardiovascular:     Rate and Rhythm: Normal rate and regular rhythm.     Pulses: Normal pulses.     Heart sounds: Normal heart sounds. No murmur.  Pulmonary:     Effort: Pulmonary effort is normal. No respiratory distress.     Breath sounds: Normal breath sounds. No wheezing or rales.  Abdominal:     General: Bowel sounds are normal. There is no distension.     Palpations: Abdomen is soft. There is no mass.     Comments: No organomegaly  Genitourinary:    Comments: Pap taken in 2018.  Bimanual normal Musculoskeletal:     Comments: Deferred to orthopedist  Lymphadenopathy:     Cervical: No cervical adenopathy.  Skin:    General: Skin is warm and dry.     Findings: No rash.  Neurological:     General: No focal deficit present.     Mental Status: She is alert and oriented to person, place, and time.     Cranial Nerves: No cranial nerve deficit.  Psychiatric:        Mood and Affect: Mood normal.        Behavior: Behavior normal.        Thought Content: Thought content normal.  Judgment: Judgment normal.           Assessment & Plan:  Essential hypertension-stable on current regimen.  Blood pressure 120/90.  Continue losartan  BMI 26.22 and weight is 160 pounds-watch diet and try to get more exercise  Hyperlipidemia-total cholesterol 249, triglycerides 182 and LDL cholesterol 142.  Has not been watching diet.  Make sure compliant with Zocor.  Hypothyroidism- stable TSH- continue thyroid replacement  Osteopenia treated with Fosamax.  Had bone density study December 2016  Impaired glucose tolerance  Metabolic syndrome  Has seen orthopedist recently regarding trochanteric bursitis and osteoarthritis of right hip  Plan: It has been a pleasure to care for you.  Continue same medications and we will be happy to transfer records to physician of your choice in [place].  Good luck and Best wishes.    Pneumococcal 23  vaccine given.  Has received zoster Shingrix series through pharmacy.  Had Prevnar 13 in 2018.  Tetanus immunization given in 2013.  Subjective:   Patient presents for Medicare Annual/Subsequent preventive examination.  Review Past Medical/Family/Social: See above   Risk Factors  Current exercise habits: Not as much exercise recently has been living in an apartment Dietary issues discussed: Low-fat low carbohydrate  Cardiac risk factors: Hyperlipidemia, hypertension, family history, impaired glucose tolerance  Depression Screen  (Note: if answer to either of the following is "Yes", a more complete depression screening is indicated)   Over the past two weeks, have you felt down, depressed or hopeless? No  Over the past two weeks, have you felt little interest or pleasure in doing things? No Have you lost interest or pleasure in daily life? No Do you often feel hopeless? No Do you cry easily over simple problems? No   Activities of Daily Living  In your present state of health, do you have any difficulty performing the following activities?:   Driving? No  Managing money? No  Feeding yourself? No  Getting from bed to chair? No  Climbing a flight of stairs? No  Preparing food and eating?: No  Bathing or showering? No  Getting dressed: No  Getting to the toilet? No  Using the toilet:No  Moving around from place to place: No  In the past year have you fallen or had a near fall?:No  Are you sexually active? No  Do you have more than one partner? No   Hearing Difficulties: No  Do you often ask people to speak up or repeat themselves? No  Do you experience ringing or noises in your ears? No  Do you have difficulty understanding soft or whispered voices? No  Do you feel that you have a problem with memory? No Do you often misplace items? No    Home Safety:  Do you have a smoke alarm at your residence? Yes Do you have grab bars in the bathroom?  No Do you have throw rugs in  your house?  Yes   Cognitive Testing  Alert? Yes Normal Appearance?Yes  Oriented to person? Yes Place? Yes  Time? Yes  Recall of three objects? Yes  Can perform simple calculations? Yes  Displays appropriate judgment?Yes  Can read the correct time from a watch face?Yes   List the Names of Other Physician/Practitioners you currently use:  See referral list for the physicians patient is currently seeing.  Orthopedist   Review of Systems: See above   Objective:     General appearance: Appears younger than stated age Head: Normocephalic, without obvious abnormality, atraumatic  Eyes: conj clear, EOMi PEERLA  Ears: normal TM's and external ear canals both ears  Nose: Nares normal. Septum midline. Mucosa normal. No drainage or sinus tenderness.  Throat: lips, mucosa, and tongue normal; teeth and gums normal  Neck: no adenopathy, no carotid bruit, no JVD, supple, symmetrical, trachea midline and thyroid not enlarged, symmetric, no tenderness/mass/nodules  No CVA tenderness.  Lungs: clear to auscultation bilaterally  Breasts: normal appearance, no masses or tenderness Heart: regular rate and rhythm, S1, S2 normal, no murmur, click, rub or gallop  Abdomen: soft, non-tender; bowel sounds normal; no masses, no organomegaly  Musculoskeletal: ROM normal in all joints, no crepitus, no deformity, Normal muscle strengthen. Back  is symmetric, no curvature. Skin: Skin color, texture, turgor normal. No rashes or lesions  Lymph nodes: Cervical, supraclavicular, and axillary nodes normal.  Neurologic: CN 2 -12 Normal, Normal symmetric reflexes. Normal coordination and gait  Psych: Alert & Oriented x 3, Mood appear stable.    Assessment:    Annual wellness medicare exam   Plan:    During the course of the visit the patient was educated and counseled about appropriate screening and preventive services including:   Annual mammogram  Annual flu vaccine recommended     Patient  Instructions (the written plan) was given to the patient.  Medicare Attestation  I have personally reviewed:  The patient's medical and social history  Their use of alcohol, tobacco or illicit drugs  Their current medications and supplements  The patient's functional ability including ADLs,fall risks, home safety risks, cognitive, and hearing and visual impairment  Diet and physical activities  Evidence for depression or mood disorders  The patient's weight, height, BMI, and visual acuity have been recorded in the chart. I have made referrals, counseling, and provided education to the patient based on review of the above and I have provided the patient with a written personalized care plan for preventive services.

## 2018-02-23 LAB — TSH: TSH: 2.2 m[IU]/L (ref 0.40–4.50)

## 2018-03-03 ENCOUNTER — Other Ambulatory Visit: Payer: Self-pay

## 2018-03-20 ENCOUNTER — Encounter: Payer: Self-pay | Admitting: Internal Medicine

## 2018-03-20 NOTE — Patient Instructions (Signed)
Continue current medications as previously prescribed.  Watch diet and try to get some exercise.  Make sure you are taking statin medication.  We will be happy to transfer your records to physician of choice in [place].  It has been a pleasure to take care of you.

## 2018-05-04 ENCOUNTER — Other Ambulatory Visit: Payer: Self-pay

## 2018-05-04 MED ORDER — SIMVASTATIN 20 MG PO TABS: 20.0000 mg | ORAL_TABLET | Freq: Every day | ORAL | 1 refills | Status: DC

## 2018-05-05 ENCOUNTER — Other Ambulatory Visit: Payer: Self-pay

## 2018-05-05 MED ORDER — SIMVASTATIN 20 MG PO TABS: 20.0000 mg | ORAL_TABLET | Freq: Every day | ORAL | 1 refills | Status: AC

## 2018-06-03 ENCOUNTER — Other Ambulatory Visit: Payer: Self-pay

## 2018-06-03 MED ORDER — LEVOTHYROXINE SODIUM 75 MCG PO TABS: ORAL_TABLET | ORAL | 2 refills | Status: AC

## 2018-06-03 NOTE — Telephone Encounter (Signed)
Patient called is requesting a refill on levothyroxine.

## 2018-07-26 ENCOUNTER — Other Ambulatory Visit: Payer: Self-pay | Admitting: Internal Medicine

## 2018-07-26 MED ORDER — LOSARTAN POTASSIUM 50 MG PO TABS: 50.0000 mg | ORAL_TABLET | Freq: Every day | ORAL | 1 refills | Status: DC

## 2018-07-26 NOTE — Telephone Encounter (Signed)
Received Fax RX request from  Pharmacy - [facility] - [address] [place], [place] [address]  Medication - losartan (COZAAR) 50 MG tablet    Last Refill - 2.12.20  Last OV - 02-22-18  Last CPE - 12.03.19

## 2018-07-26 NOTE — Addendum Note (Signed)
Addended by: Gregery Na on: 07/26/2018 09:45 AM   Modules accepted: Orders

## 2018-07-28 ENCOUNTER — Other Ambulatory Visit: Payer: Self-pay

## 2018-07-28 MED ORDER — LOSARTAN POTASSIUM 50 MG PO TABS: 50.0000 mg | ORAL_TABLET | Freq: Every day | ORAL | 1 refills | Status: AC

## 2018-07-29 ENCOUNTER — Telehealth: Payer: Self-pay | Admitting: Internal Medicine

## 2018-07-29 DIAGNOSIS — I10 Essential (primary) hypertension: Secondary | ICD-10-CM

## 2018-07-29 NOTE — Telephone Encounter (Addendum)
Faxed 79 pages of Medical Records to Dr Garlan Fair to transfer care, patient moved to [place], [place].

## 2018-11-20 ENCOUNTER — Other Ambulatory Visit: Payer: Self-pay | Admitting: Internal Medicine

## 2018-11-20 NOTE — Telephone Encounter (Signed)
Pt has moved and has to find another primary care MD in that state.

## 2018-12-14 ENCOUNTER — Other Ambulatory Visit: Payer: Self-pay | Admitting: Internal Medicine

## 2019-01-05 IMAGING — MG 2D DIGITAL SCREENING BILATERAL MAMMOGRAM WITH 3D TOMO WITH CAD
8 of 12 series · 8 of 28 positions shown · non-contrast
Comparison: Previous exam(s).

CLINICAL DATA: Screening.

EXAM:
2D DIGITAL SCREENING BILATERAL MAMMOGRAM WITH 3D TOMO WITH CAD

[R MLO]
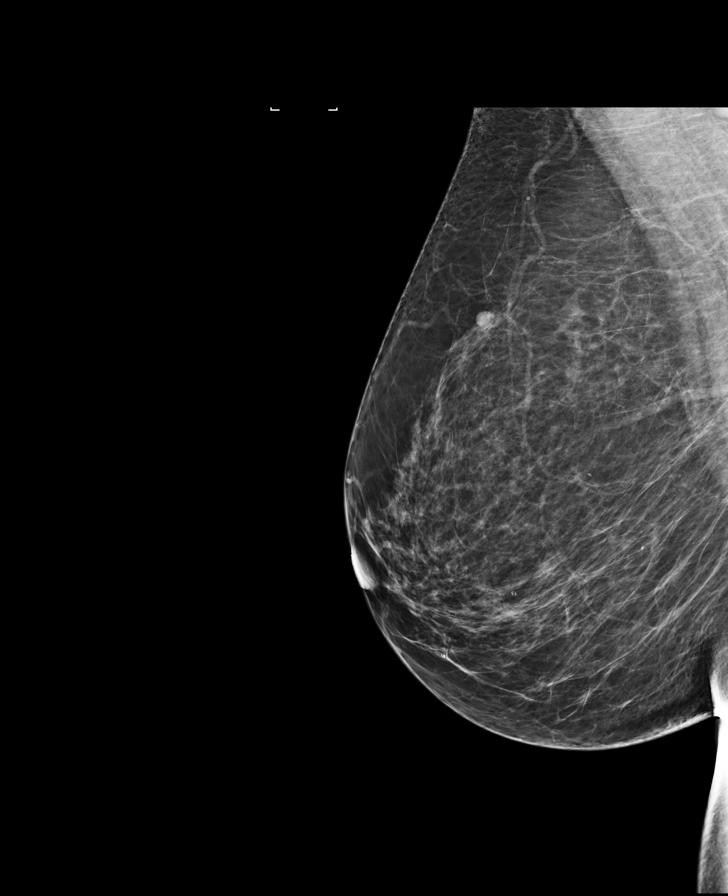

[R MLO synth-2D]
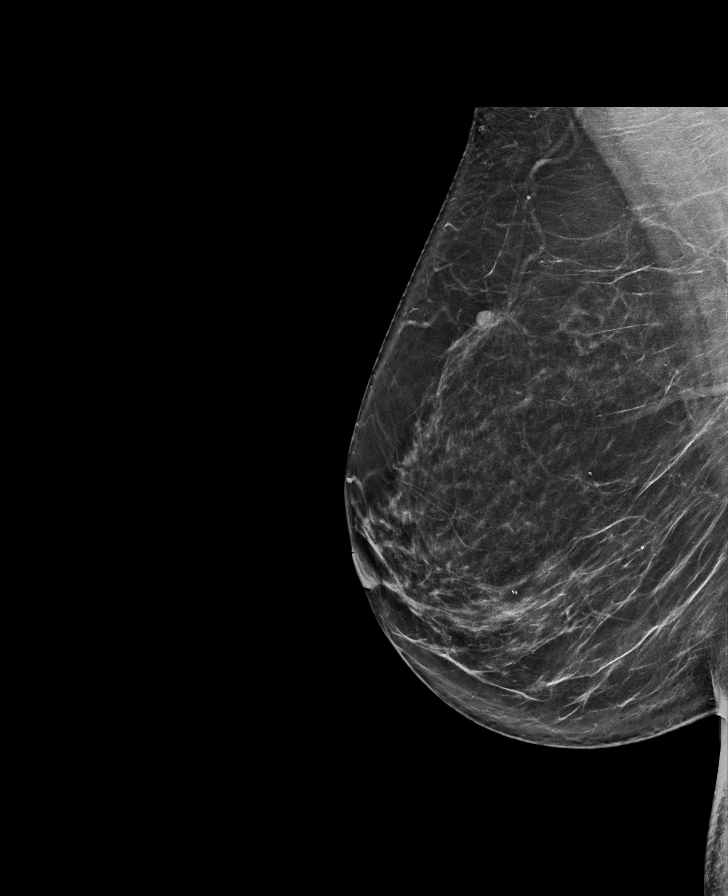

[L CC]
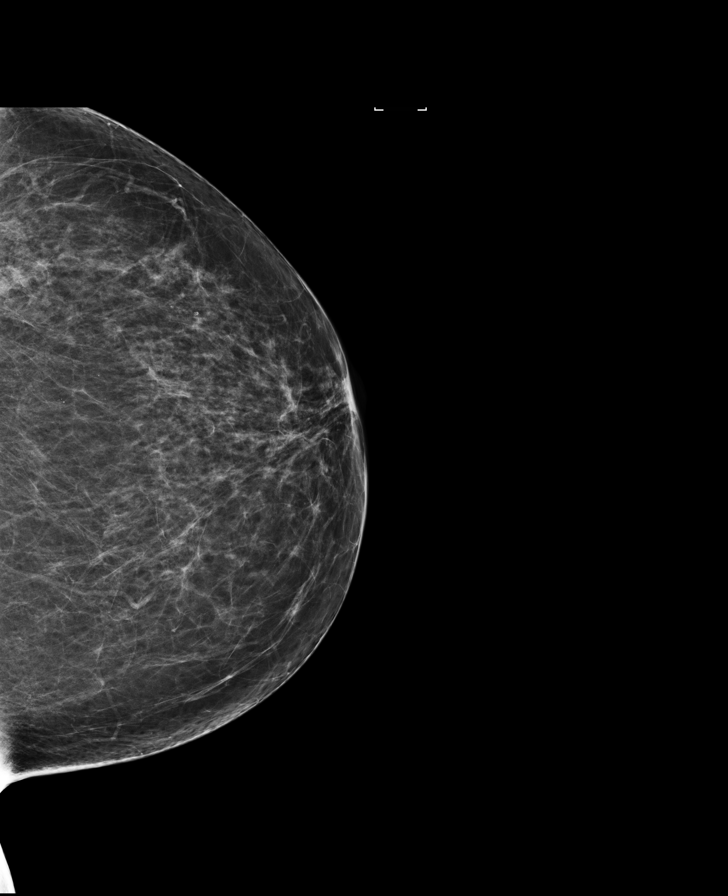

[L CC synth-2D]
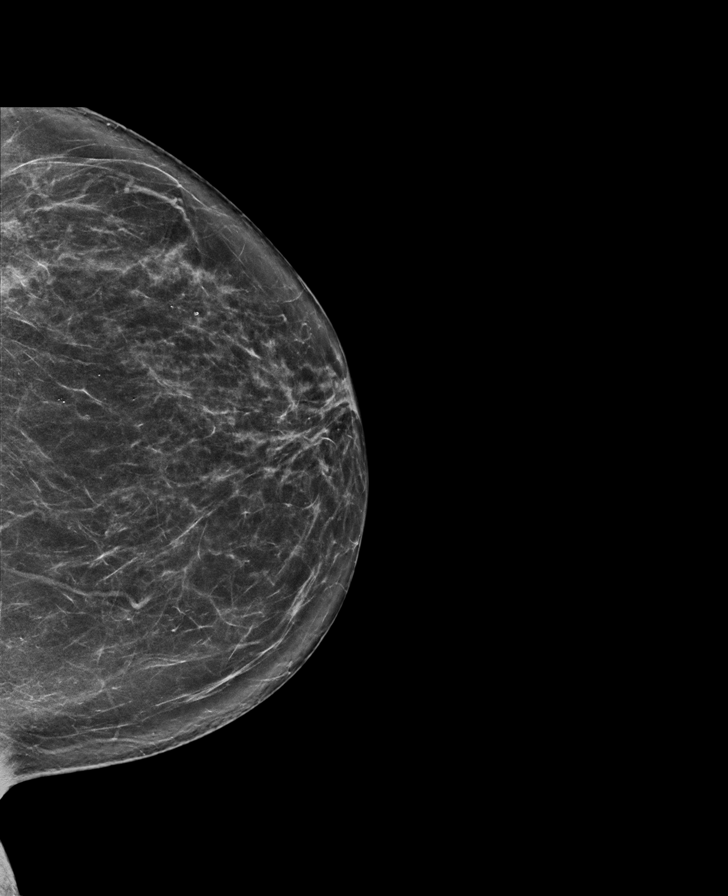

[R CC]
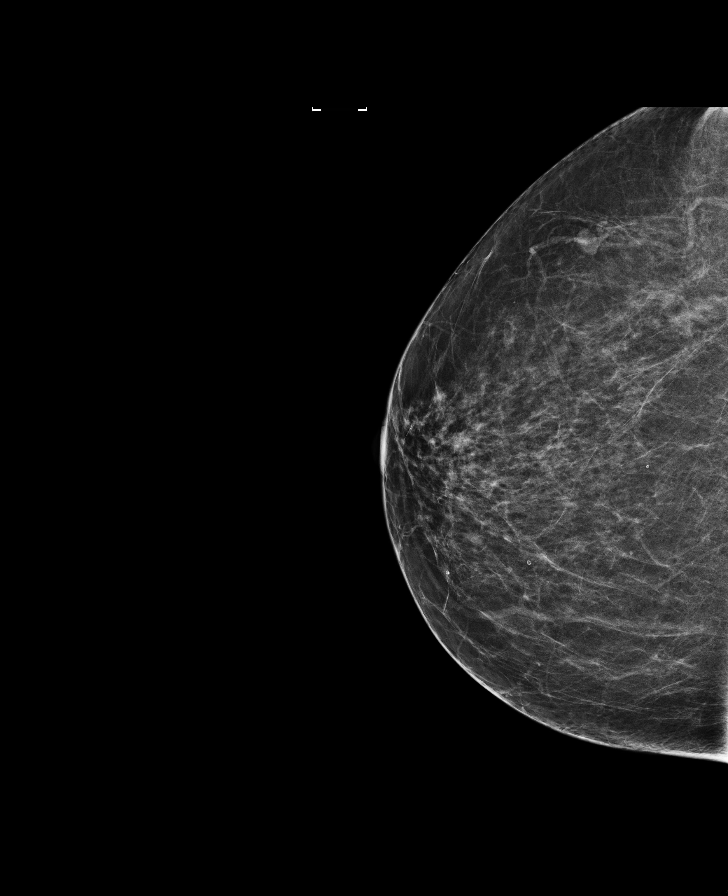

[L MLO]
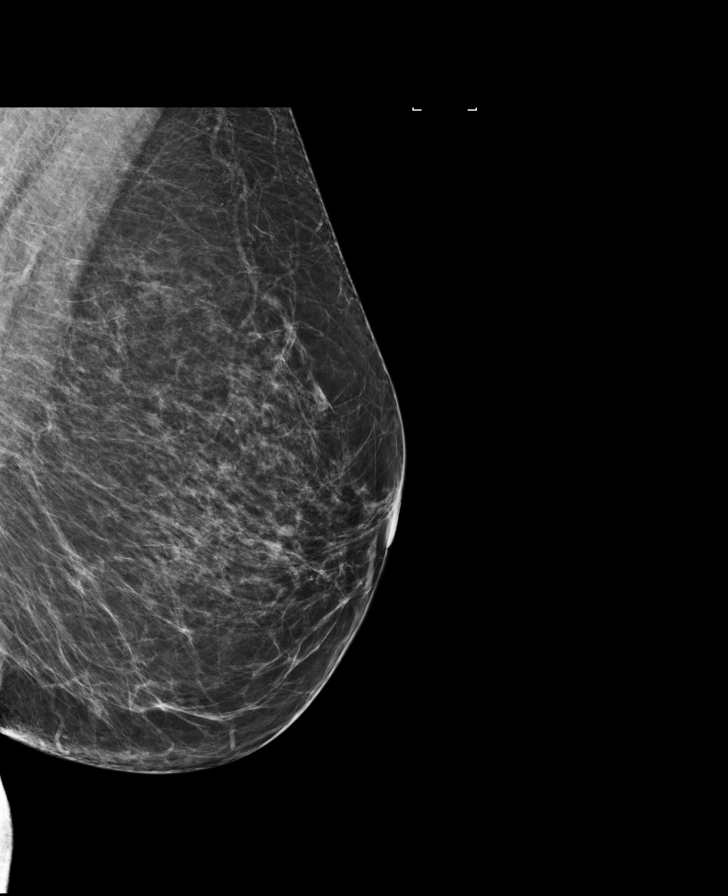

[L MLO synth-2D]
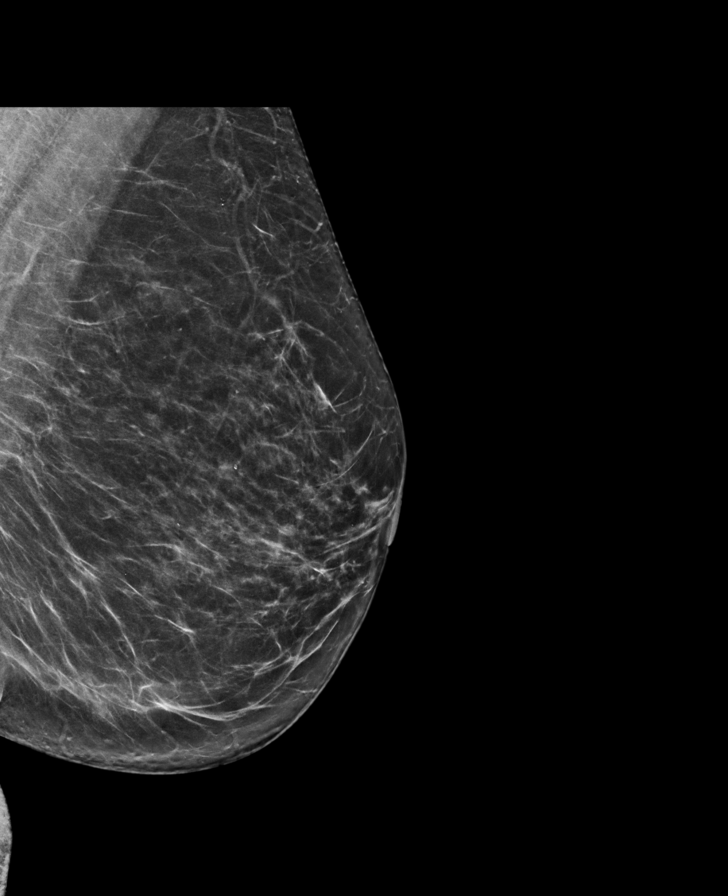

[R CC synth-2D]
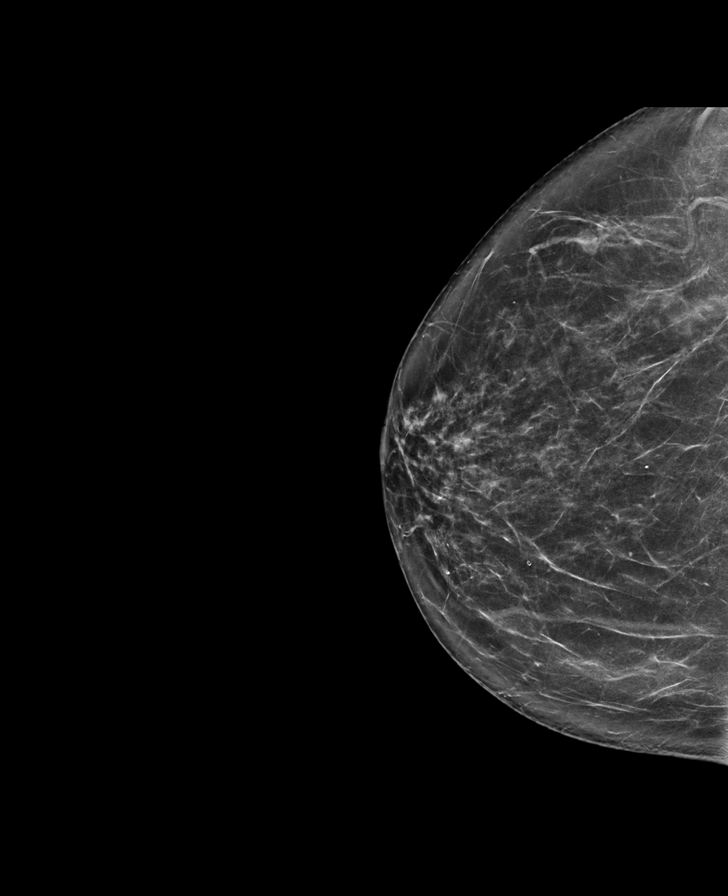

[8 of 28 positions shown; findings below may reference images not displayed]

ACR Breast Density Category b: There are scattered areas of
fibroglandular density.
FINDINGS: There are no findings suspicious for malignancy. Images were
processed with CAD.
IMPRESSION: No mammographic evidence of malignancy. A result letter of this
screening mammogram will be mailed directly to the patient.

RECOMMENDATION:
Screening mammogram in one year. (Code:GE-P-ZS0)

BI-RADS CATEGORY  1: Negative.

## 2019-01-12 ENCOUNTER — Other Ambulatory Visit: Payer: Self-pay | Admitting: Internal Medicine

## 2019-04-29 ENCOUNTER — Other Ambulatory Visit: Payer: Self-pay | Admitting: Internal Medicine
